# Patient Record
Sex: Female | Born: 1970 | Race: White | Hispanic: No | Marital: Married | Smoking: Former smoker
Health system: Southern US, Community
[De-identification: ages and names within clinical notes are randomized; demographics above are authoritative.]

## PROBLEM LIST (undated history)

## (undated) DIAGNOSIS — K589 Irritable bowel syndrome without diarrhea: Secondary | ICD-10-CM

## (undated) DIAGNOSIS — M069 Rheumatoid arthritis, unspecified: Secondary | ICD-10-CM

## (undated) DIAGNOSIS — I10 Essential (primary) hypertension: Secondary | ICD-10-CM

## (undated) DIAGNOSIS — K5792 Diverticulitis of intestine, part unspecified, without perforation or abscess without bleeding: Secondary | ICD-10-CM

## (undated) DIAGNOSIS — I499 Cardiac arrhythmia, unspecified: Secondary | ICD-10-CM

## (undated) DIAGNOSIS — E669 Obesity, unspecified: Secondary | ICD-10-CM

## (undated) DIAGNOSIS — F17200 Nicotine dependence, unspecified, uncomplicated: Secondary | ICD-10-CM

## (undated) DIAGNOSIS — B009 Herpesviral infection, unspecified: Secondary | ICD-10-CM

## (undated) DIAGNOSIS — F419 Anxiety disorder, unspecified: Secondary | ICD-10-CM

## (undated) HISTORY — DX: Anxiety disorder, unspecified: F41.9

## (undated) HISTORY — DX: Obesity, unspecified: E66.9

## (undated) HISTORY — DX: Herpesviral infection, unspecified: B00.9

## (undated) HISTORY — DX: Nicotine dependence, unspecified, uncomplicated: F17.200

## (undated) HISTORY — DX: Diverticulitis of intestine, part unspecified, without perforation or abscess without bleeding: K57.92

## (undated) HISTORY — PX: ABDOMINAL HYSTERECTOMY: SHX81

## (undated) HISTORY — PX: LAPAROSCOPIC ASSISTED VAGINAL HYSTERECTOMY: SHX5398

## (undated) HISTORY — PX: ABLATION: SHX5711

## (undated) HISTORY — DX: Irritable bowel syndrome, unspecified: K58.9

---

## 2002-07-24 ENCOUNTER — Other Ambulatory Visit: Admission: RE | Admit: 2002-07-24 | Discharge: 2002-07-24 | Payer: Self-pay | Admitting: Obstetrics and Gynecology

## 2004-02-17 ENCOUNTER — Other Ambulatory Visit: Admission: RE | Admit: 2004-02-17 | Discharge: 2004-02-17 | Payer: Self-pay | Admitting: Obstetrics and Gynecology

## 2004-10-14 ENCOUNTER — Ambulatory Visit (HOSPITAL_COMMUNITY): Admission: RE | Admit: 2004-10-14 | Discharge: 2004-10-14 | Payer: Self-pay | Admitting: Obstetrics and Gynecology

## 2004-10-14 ENCOUNTER — Encounter (INDEPENDENT_AMBULATORY_CARE_PROVIDER_SITE_OTHER): Payer: Self-pay | Admitting: Specialist

## 2004-12-08 ENCOUNTER — Ambulatory Visit: Payer: Self-pay | Admitting: Cardiology

## 2005-02-24 ENCOUNTER — Other Ambulatory Visit: Admission: RE | Admit: 2005-02-24 | Discharge: 2005-02-24 | Payer: Self-pay | Admitting: Obstetrics and Gynecology

## 2007-08-23 ENCOUNTER — Ambulatory Visit: Payer: Self-pay | Admitting: Cardiology

## 2009-01-12 ENCOUNTER — Encounter: Admission: RE | Admit: 2009-01-12 | Discharge: 2009-01-12 | Payer: Self-pay | Admitting: Obstetrics and Gynecology

## 2010-04-15 ENCOUNTER — Encounter: Admission: RE | Admit: 2010-04-15 | Discharge: 2010-04-15 | Payer: Self-pay | Admitting: Obstetrics and Gynecology

## 2011-01-17 ENCOUNTER — Encounter: Payer: Self-pay | Admitting: Obstetrics and Gynecology

## 2011-05-10 NOTE — Assessment & Plan Note (Signed)
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  Patient has been referred for a  pressure-like sensation that she has been experiencing over the last  several weeks; however, over the last several days after she was given  some Xanax from Dr. Sherril Croon, it has much improved.  She denies any  substernal chest pain on exertion.  She states that she has a heaviness  usually in the evening, which is sometimes associated with palpitations.  She denies any orthopnea or PND.  She has no syncope.  Patient reports  that she has gained 50 pounds over the year.  She states that part of it  has been stress-induced, although stress in her life now has somewhat  abated.  She has been seen in the office by Dr. Diona Browner for  palpitations and was placed on Cardizem; however, she ended up with  lower extremity edema and had to discontinue this.  She is now currently  on atenolol.  She reports rare palpitations.  She has had an  echocardiographic study done, which by preliminary report was within  normal limits.   MEDICATIONS:  1. Atenolol 50 mg p.o. daily.  2. Xanax 0.25 mg p.o. b.i.d.   ALLERGIES:  No known drug allergies.  The patient does have a SHELLFISH  allergy.   PAST MEDICAL HISTORY:  1. Status post endometrial ablation back in October, 2004.  2. Status post bilateral tubal ligation in 1996.   SOCIAL HISTORY:  Patient is divorced.  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  She used to smoke a half pack a day.  Denies any  significant alcohol use.   FAMILY HISTORY:  Significant for atrial fibrillation and hypertension.   REVIEW OF SYSTEMS:  As per HPI.  No nausea, vomiting, fever,  chills,  melena, hematochezia, dysuria or frequency.  Otherwise within normal  limits.   PHYSICAL EXAMINATION:  VITAL SIGNS:  Blood pressure 134/85, heart rate  52, weight is 188 pounds.  NECK:  Normal carotid upstrokes.  No carotid bruits.  LUNGS:  Clear breath sounds bilaterally.  HEART:  Regular rate and rhythm.  Normal S1 and S2.  ABDOMEN:  Soft.  EXTREMITIES:  No clubbing, cyanosis or edema.  NEURO:  Patient is alert, oriented, grossly nonfocal.   PROBLEM LIST:  1. Atypical chest pain.  2. History of palpitations.  3. Prior history of tobacco use.   PLAN:  1. The patient's chest pain is very atypical.  I have recommended to      the patient to proceed with stress testing.  2. We will review the echocardiogram from Dr. Sherril Croon.  3. Patient also was given the advice to increase her atenolol to 25 mg      in the evening and continue  an additional 50 mg in the morning.  4. Patient can follow up with [place] after review of stress testing.     Learta Codding, MD,FACC  Electronically Signed    GED/MedQ  DD: 08/24/2007  DT: 08/25/2007  Job #: [id number]   cc:   Doreen Beam

## 2011-05-13 NOTE — Op Note (Signed)
NAME:  Kayla Olson, Kayla Olson               ACCOUNT NO.:  [id number]   MEDICAL RECORD NO.:  [id number]          PATIENT TYPE:  AMB   LOCATION:  [facility]                           FACILITY:  [facility]   PHYSICIAN:  Guy Sandifer. Tomblin II, M.D.DATE OF BIRTH:  1971/01/29   DATE OF PROCEDURE:  10/14/2004  DATE OF DISCHARGE:                                 OPERATIVE REPORT   PREOPERATIVE DIAGNOSIS:  Menometrorrhagia.   POSTOPERATIVE DIAGNOSIS:  Menometrorrhagia.   PROCEDURE:  Hysteroscopy, dilation and curettage, Novasure endometrial  ablation, and 1% Xylocaine paracervical block.   SURGEON:  Guy Sandifer. Henderson Cloud, M.D.   ANESTHESIA:  MAC.   ESTIMATED BLOOD LOSS:  Less than 50 mL.   INPUTS AND OUTPUTS:  Distending media, 20 mL deficit.   SPECIMENS:  Endometrial curettings.   INDICATIONS AND CONSENT:  The patient is a 40 year old white female, G2, P2,  status post tubal ligation with heavy irregular menses.  The details are  dictated in the history and physical.  After a discussion of the options,  she is being admitted for hysteroscopy, D&C, and Novasure endometrial  ablation.  The potential risks and complications were discussed with the  patient preoperatively including but not limited to infection, uterine  perforation, bowel, bladder or ureteral damage, bleeding requiring  transfusion of blood products with possible transfusion reaction, HIV and  hepatitis acquisition, DVT, PE, pneumonia, laparotomy, recurrent  menorrhagia, postoperative pain, and tubal syndrome.  All questions have  been answered and consent is signed on the chart.   FINDINGS:  The endometrial canal was normal.   PROCEDURE:  The patient was taken to the operating room where she was  identified, placed in the dorsal supine position, and intravenous anesthetic  is administered.  She was then placed in the dorsal lithotomy position where  she was prepped abdominal and vaginally with Hibiclens, straight  catheterized, and draped in a  sterile fashion.  A bivalve speculum was  placed in the vagina and the anterior cervical lip is injected with 1%  Xylocaine plain.  It was then grasped with a single tooth tenaculum.  A  paracervical block was placed at the 2, 4, 5, 7, 8, and 10 o'clock positions  with approximately 20 mL total of 1% Xylocaine plain.  The uterus was then  sounded to 8 cm and the endocervical canal sounds to 4 cm with a Hegar  dilator.  The cervix was dilated to 27 Pratt dilator.  The diagnostic  hysteroscope was placed in the endocervical canal and advanced under direct  visualization using distending media.  The above findings were noted.  The  hysteroscope was withdrawn and sharp curettage was carried out.  The  Novasure device was placed in the intrauterine cavity according to the  manufacturers directions.  The intrauterine cavity width is 2.8 cm.  After a  good cavity test, ablation is carried out for 1 minute 50 seconds.  The  Novasure  device is removed intact.  Reinspection with the hysteroscope reveals good  ablation and no evidence of perforation.  All instruments are removed and  good hemostasis is noted.  All counts were correct.  The patient was  awakened and taken to the recovery room in stable condition.      JET/MEDQ  D:  10/14/2004  T:  10/14/2004  Job:  [id number]

## 2011-05-13 NOTE — H&P (Signed)
NAME:  Kayla Olson, Kayla Olson               ACCOUNT NO.:  [id number]   MEDICAL RECORD NO.:  [id number]          PATIENT TYPE:  AMB   LOCATION:  [facility]                           FACILITY:  [facility]   PHYSICIAN:  Guy Sandifer. Tomblin II, M.D.DATE OF BIRTH:  [birth year]   DATE OF ADMISSION:  10/14/2004  DATE OF DISCHARGE:                                HISTORY & PHYSICAL   CHIEF COMPLAINT:  Heavy irregular menses.   HISTORY OF PRESENT ILLNESS:  This patient is a 40 year old divorced white  female, G2, P2, status post tubal ligation who has persistent irregular and  heavy menses.  She is intolerant of the birth control pill.  Ultrasound in  September of 2003 reveals a normal size uterus.  After a careful discussion  of the options, she is being admitted for hysteroscopy, D&C and Novasure  endometrial ablation.   PAST MEDICAL HISTORY:  1.  Hypertension.  2.  Irregular heart rate.  3.  Abnormal Pap smear.   PAST SURGICAL HISTORY:  Tubal ligation in 1996.   MEDICATIONS:  1.  Cardizem 120 mg daily.  2.  Xanax 0.25 mg daily p.r.n.  3.  Avelox daily that the patient will finish on October 13, 2004 or October 14, 2004.   FAMILY HISTORY:  Diabetes in grandmother, high blood pressure in father,  unknown type of cancer.   OBSTETRIC HISTORY:  Vaginal delivery x2.   SOCIAL HISTORY:  The patient denies tobacco, alcohol or drug abuse.   REVIEW OF SYSTEMS:  NEUROLOGICAL:  The patient has menstrual migraines.  CARDIAC:  Denies chest pain.  PULMONARY:  Recent upper respiratory  infection, now feeling better on antibiotics.  GASTROINTESTINAL:  Denies  recent changes in bowel habits.   PHYSICAL EXAMINATION:  VITAL SIGNS:  Height 5 feet 3 inches, weight 130  pounds.  Blood pressure 120/78.  HEENT:  Without thyromegaly.  LUNGS:  Clear to auscultation.  HEART:  Regular rate and rhythm.  BACK:  Without CVA tenderness.  BREASTS:  Without masses or active discharge.  ABDOMEN:  Soft and nontender without  masses.  PELVIC:  Vulva, vagina and cervix without lesions.  Uterus is retroverted,  normal size, mobile and nontender.  Adnexa nontender without masses.  EXTREMITIES/NEUROLOGIC:  Exams grossly within normal limits.   ASSESSMENT:  Menometrorrhagia.   PLAN:  Hysteroscopy, dilatation and curettage and Novasure endometrial  ablation.      JET/MEDQ  D:  10/12/2004  T:  10/12/2004  Job:  [id number]

## 2012-06-12 ENCOUNTER — Other Ambulatory Visit: Payer: Self-pay | Admitting: Internal Medicine

## 2012-06-12 DIAGNOSIS — N644 Mastodynia: Secondary | ICD-10-CM

## 2012-06-12 DIAGNOSIS — N63 Unspecified lump in unspecified breast: Secondary | ICD-10-CM

## 2012-06-19 ENCOUNTER — Ambulatory Visit
Admission: RE | Admit: 2012-06-19 | Discharge: 2012-06-19 | Disposition: A | Discharge status: Home / Self Care | Payer: BC Managed Care – PPO | Source: Ambulatory Visit | Attending: Internal Medicine | Admitting: Internal Medicine

## 2012-06-19 ENCOUNTER — Other Ambulatory Visit: Payer: Self-pay | Admitting: Internal Medicine

## 2012-06-19 DIAGNOSIS — N63 Unspecified lump in unspecified breast: Secondary | ICD-10-CM

## 2012-06-19 DIAGNOSIS — N644 Mastodynia: Secondary | ICD-10-CM

## 2014-04-13 ENCOUNTER — Emergency Department (HOSPITAL_COMMUNITY)
Admission: EM | Admit: 2014-04-13 | Discharge: 2014-04-13 | Disposition: A | Discharge status: Home / Self Care | Payer: BC Managed Care – PPO | Attending: Emergency Medicine | Admitting: Emergency Medicine

## 2014-04-13 ENCOUNTER — Encounter (HOSPITAL_COMMUNITY): Payer: Self-pay | Admitting: Emergency Medicine

## 2014-04-13 ENCOUNTER — Emergency Department (HOSPITAL_COMMUNITY): Payer: BC Managed Care – PPO

## 2014-04-13 DIAGNOSIS — R52 Pain, unspecified: Secondary | ICD-10-CM | POA: Insufficient documentation

## 2014-04-13 DIAGNOSIS — I10 Essential (primary) hypertension: Secondary | ICD-10-CM | POA: Insufficient documentation

## 2014-04-13 DIAGNOSIS — F17200 Nicotine dependence, unspecified, uncomplicated: Secondary | ICD-10-CM | POA: Insufficient documentation

## 2014-04-13 DIAGNOSIS — R110 Nausea: Secondary | ICD-10-CM | POA: Insufficient documentation

## 2014-04-13 DIAGNOSIS — R079 Chest pain, unspecified: Secondary | ICD-10-CM | POA: Insufficient documentation

## 2014-04-13 DIAGNOSIS — R0602 Shortness of breath: Secondary | ICD-10-CM | POA: Insufficient documentation

## 2014-04-13 HISTORY — DX: Cardiac arrhythmia, unspecified: I49.9

## 2014-04-13 HISTORY — DX: Essential (primary) hypertension: I10

## 2014-04-13 LAB — COMPREHENSIVE METABOLIC PANEL
ALK PHOS: 60 U/L (ref 39–117)
ALT: 31 U/L (ref 0–35)
AST: 24 U/L (ref 0–37)
Albumin: 3.4 g/dL — ABNORMAL LOW (ref 3.5–5.2)
BUN: 7 mg/dL (ref 6–23)
CALCIUM: 9.4 mg/dL (ref 8.4–10.5)
CO2: 28 meq/L (ref 19–32)
Chloride: 97 mEq/L (ref 96–112)
Creatinine, Ser: 0.65 mg/dL (ref 0.50–1.10)
GFR calc Af Amer: >90 mL/min (ref >90)
Glucose, Bld: 100 mg/dL — ABNORMAL HIGH (ref 70–99)
Potassium: 3.6 mEq/L — ABNORMAL LOW (ref 3.7–5.3)
Sodium: 136 mEq/L — ABNORMAL LOW (ref 137–147)
Total Bilirubin: 0.4 mg/dL (ref 0.3–1.2)
Total Protein: 6.8 g/dL (ref 6.0–8.3)

## 2014-04-13 LAB — CBC WITH DIFFERENTIAL/PLATELET
BASOS ABS: 0 10*3/uL (ref 0.0–0.1)
BASOS PCT: 0 % (ref 0–1)
EOS PCT: 0 % (ref 0–5)
Eosinophils Absolute: 0 10*3/uL (ref 0.0–0.7)
HCT: 42 % (ref 36.0–46.0)
HEMOGLOBIN: 14.3 g/dL (ref 12.0–15.0)
LYMPHS PCT: 12 % (ref 12–46)
Lymphs Abs: 1.5 10*3/uL (ref 0.7–4.0)
MCH: 32 pg (ref 26.0–34.0)
MCHC: 34 g/dL (ref 30.0–36.0)
MCV: 94 fL (ref 78.0–100.0)
MONO ABS: 1.3 10*3/uL — AB (ref 0.1–1.0)
Monocytes Relative: 10 % (ref 3–12)
NEUTROS PCT: 78 % — AB (ref 43–77)
Neutro Abs: 9.9 10*3/uL — ABNORMAL HIGH (ref 1.7–7.7)
PLATELETS: 230 10*3/uL (ref 150–400)
RBC: 4.47 MIL/uL (ref 3.87–5.11)
RDW: 13 % (ref 11.5–15.5)
WBC: 12.7 10*3/uL — ABNORMAL HIGH (ref 4.0–10.5)

## 2014-04-13 LAB — PRO B NATRIURETIC PEPTIDE: PRO B NATRI PEPTIDE: 29.6 pg/mL (ref 0–125)

## 2014-04-13 LAB — TROPONIN I: Troponin I: <0.3 ng/mL (ref <0.30)

## 2014-04-13 LAB — D-DIMER, QUANTITATIVE (NOT AT ARMC)

## 2014-04-13 MED ORDER — ASPIRIN 81 MG PO CHEW
324.0000 mg | CHEWABLE_TABLET | Freq: Once | ORAL | Status: AC
  Administered 2014-04-13: 324 mg via ORAL
  Filled 2014-04-13: qty 4

## 2014-04-13 MED ORDER — NITROGLYCERIN 0.4 MG SL SUBL
0.4000 mg | SUBLINGUAL_TABLET | SUBLINGUAL | Status: DC | PRN
  Filled 2014-04-13: qty 1

## 2014-04-13 NOTE — ED Provider Notes (Signed)
CSN: [id number]     Arrival date & time 04/13/14  1710 History  This chart was scribed for Kayla Creasehristopher J. Alecsander Hattabaugh, * by Danella Maiersaroline Early, ED Scribe. This patient was seen in room APA12/APA12 and the patient's care was started at 5:19 PM.    Chief Complaint  Patient presents with  . Chest Pain   The history is provided by the patient. No language interpreter was used.   HPI Comments: Irving CopasJonsie Drolet is a 43 y.o. female with a h/o HTN who presents to the Emergency Department complaining of non-radiating center CP described as tightness and pressure onset when she woke up this morning. She reports associated SOB that has gradually worsened throughout the day. She reports intermittent nausea today. She denies cough, abdominal pain. She reports h/o PVCs. She has had an ECHO which showed heart wall thinkening from HTN since early 20s but otherwise normal. Never had a stress test. No family h/o early cardiac death. She denies acid reflux.    Past Medical History  Diagnosis Date  . Hypertension   . Arrhythmia    Past Surgical History  Procedure Laterality Date  . Ablation     Family History  Problem Relation Age of Onset  . Cancer Mother   . Atrial fibrillation Mother    History  Substance Use Topics  . Smoking status: Current Every Day Smoker -- 1.00 packs/day for 22 years    Types: Cigarettes  . Smokeless tobacco: Never Used  . Alcohol Use: Yes     Comment: occasionally   OB History   Grav Para Term Preterm Abortions TAB SAB Ect Mult Living   2 2 2       2      Review of Systems  Respiratory: Positive for shortness of breath. Negative for cough.   Cardiovascular: Positive for chest pain.  Gastrointestinal: Positive for nausea. Negative for abdominal pain.  All other systems reviewed and are negative.     Allergies  Codeine  Home Medications   Prior to Admission medications   Not on File   BP 140/84  Pulse 98  Temp(Src) 98.6 F (37 C) (Oral)  Resp 20  Ht 5\' 3"  (1.6 m)   Wt 170 lb (77.111 kg)  BMI 30.12 kg/m2  SpO2 100% Physical Exam  Constitutional: She is oriented to person, place, and time. She appears well-developed and well-nourished. No distress.  HENT:  Head: Normocephalic and atraumatic.  Right Ear: Hearing normal.  Left Ear: Hearing normal.  Nose: Nose normal.  Mouth/Throat: Oropharynx is clear and moist and mucous membranes are normal.  Eyes: Conjunctivae and EOM are normal. Pupils are equal, round, and reactive to light.  Neck: Normal range of motion. Neck supple.  Cardiovascular: Regular rhythm, S1 normal and S2 normal.  Exam reveals no gallop and no friction rub.   No murmur heard. Pulmonary/Chest: Effort normal and breath sounds normal. No respiratory distress. She exhibits no tenderness.  Abdominal: Soft. Normal appearance and bowel sounds are normal. There is no hepatosplenomegaly. There is no tenderness. There is no rebound, no guarding, no tenderness at McBurney's point and negative Murphy's sign. No hernia.  Musculoskeletal: Normal range of motion.  Neurological: She is alert and oriented to person, place, and time. She has normal strength. No cranial nerve deficit or sensory deficit. Coordination normal. GCS eye subscore is 4. GCS verbal subscore is 5. GCS motor subscore is 6.  Skin: Skin is warm, dry and intact. No rash noted. No cyanosis.  Psychiatric: She has  a normal mood and affect. Her speech is normal and behavior is normal. Thought content normal.    ED Course  Procedures (including critical care time) Medications  nitroGLYCERIN (NITROSTAT) SL tablet 0.4 mg (0.4 mg Sublingual Not Given 04/13/14 1815)  aspirin chewable tablet 324 mg (324 mg Oral Given 04/13/14 1815)    DIAGNOSTIC STUDIES: Oxygen Saturation is 100% on RA, normal by my interpretation.    COORDINATION OF CARE: 5:24 PM- Discussed treatment plan with pt which includes CXR, blood work, troponin. Pt agrees to plan.    Labs Review Labs Reviewed  CBC WITH  DIFFERENTIAL - Abnormal; Notable for the following:    WBC 12.7 (*)    Neutrophils Relative % 78 (*)    Neutro Abs 9.9 (*)    Monocytes Absolute 1.3 (*)    All other components within normal limits  COMPREHENSIVE METABOLIC PANEL - Abnormal; Notable for the following:    Sodium 136 (*)    Potassium 3.6 (*)    Glucose, Bld 100 (*)    Albumin 3.4 (*)    All other components within normal limits  PRO B NATRIURETIC PEPTIDE  TROPONIN I  D-DIMER, QUANTITATIVE  TROPONIN I    Imaging Review Dg Chest Port 1 View  04/13/2014   CLINICAL DATA:  Mid left-sided chest pain.  EXAM: PORTABLE CHEST - 1 VIEW  COMPARISON:  08/11/2011.  FINDINGS: Lung volumes are low. No consolidative airspace disease. No pleural effusions. No pneumothorax. No pulmonary nodule or mass noted. Pulmonary vasculature and the cardiomediastinal silhouette are within normal limits.  IMPRESSION: Low lung volumes without radiographic evidence of acute cardiopulmonary disease.   Electronically Signed   By: Trudie Reedaniel  Entrikin M.D.   On: 04/13/2014 17:55     EKG Interpretation None      Date: 04/13/2014  Rate: 94  Rhythm: normal sinus rhythm  QRS Axis: normal  Intervals: normal  ST/T Wave abnormalities: nonspecific T wave changes and Ant-septal leads  Conduction Disutrbances:none  Narrative Interpretation:   Old EKG Reviewed: none available   MDM   Final diagnoses:  Acute chest pain   Patient presents to the ER for evaluation of chest pain. She has risk factors that include hypertension and smoking history. There is no family history of heart disease, she is not a diabetic. Symptoms came on at rest. There was no radiation of the pain, it was central and described as a pressure and heaviness. He was present for more than an hour and then spontaneously resolved. She declined a nitroglycerin. The pain went away without any interventions other than aspirin.  Patient's initial EKG did show T-wave inversions in the anterior and  septal leads. There are no previous EKGs to compare. There were no ST segment deviations. This is nonspecific, but can be considered possible sign of ischemia. This was shared with the patient. Her troponin was negative. I discussed her relatively low cardiac risk category, however with the abnormal EKG, recommended hospitalization. The patient was very hesitant. She reports that she works for her primary care physician, Doctor Kirstie PeriAshish Shah, and would prefer to followup with him. I did explain to her the idea of serial cardiac enzymes and the possibility of missing an MI without continued observation. She understands this but wishes to be discharged and see her doctor in the morning. She was therefore held for a repeat troponin. This was negative as well patient continues to be symptom-free.  I personally performed the services described in this documentation, which was scribed in  my presence. The recorded information has been reviewed and is accurate.    Kayla Crease, MD 04/13/14 2113

## 2014-04-13 NOTE — Discharge Instructions (Signed)
Chest Pain (Nonspecific) °It is often hard to give a specific diagnosis for the cause of chest pain. There is always a chance that your pain could be related to something serious, such as a heart attack or a blood clot in the lungs. You need to follow up with your caregiver for further evaluation. °CAUSES  °· Heartburn. °· Pneumonia or bronchitis. °· Anxiety or stress. °· Inflammation around your heart (pericarditis) or lung (pleuritis or pleurisy). °· A blood clot in the lung. °· A collapsed lung (pneumothorax). It can develop suddenly on its own (spontaneous pneumothorax) or from injury (trauma) to the chest. °· Shingles infection (herpes zoster virus). °The chest wall is composed of bones, muscles, and cartilage. Any of these can be the source of the pain. °· The bones can be bruised by injury. °· The muscles or cartilage can be strained by coughing or overwork. °· The cartilage can be affected by inflammation and become sore (costochondritis). °DIAGNOSIS  °Lab tests or other studies, such as X-rays, electrocardiography, stress testing, or cardiac imaging, may be needed to find the cause of your pain.  °TREATMENT  °· Treatment depends on what may be causing your chest pain. Treatment may include: °· Acid blockers for heartburn. °· Anti-inflammatory medicine. °· Pain medicine for inflammatory conditions. °· Antibiotics if an infection is present. °· You may be advised to change lifestyle habits. This includes stopping smoking and avoiding alcohol, caffeine, and chocolate. °· You may be advised to keep your head raised (elevated) when sleeping. This reduces the chance of acid going backward from your stomach into your esophagus. °· Most of the time, nonspecific chest pain will improve within 2 to 3 days with rest and mild pain medicine. °HOME CARE INSTRUCTIONS  °· If antibiotics were prescribed, take your antibiotics as directed. Finish them even if you start to feel better. °· For the next few days, avoid physical  activities that bring on chest pain. Continue physical activities as directed. °· Do not smoke. °· Avoid drinking alcohol. °· Only take over-the-counter or prescription medicine for pain, discomfort, or fever as directed by your caregiver. °· Follow your caregiver's suggestions for further testing if your chest pain does not go away. °· Keep any follow-up appointments you made. If you do not go to an appointment, you could develop lasting (chronic) problems with pain. If there is any problem keeping an appointment, you must call to reschedule. °SEEK MEDICAL CARE IF:  °· You think you are having problems from the medicine you are taking. Read your medicine instructions carefully. °· Your chest pain does not go away, even after treatment. °· You develop a rash with blisters on your chest. °SEEK IMMEDIATE MEDICAL CARE IF:  °· You have increased chest pain or pain that spreads to your arm, neck, jaw, back, or abdomen. °· You develop shortness of breath, an increasing cough, or you are coughing up blood. °· You have severe back or abdominal pain, feel nauseous, or vomit. °· You develop severe weakness, fainting, or chills. °· You have a fever. °THIS IS AN EMERGENCY. Do not wait to see if the pain will go away. Get medical help at once. Call your local emergency services (911 in [place] Do not drive yourself to the hospital. °MAKE SURE YOU:  °· Understand these instructions. °· Will watch your condition. °· Will get help right away if you are not doing well or get worse. °Document Released: 09/21/2005 Document Revised: 03/05/2012 Document Reviewed: 07/17/2008 °[employer]® Patient Information ©2014 [employer]   [employer]. ° °

## 2014-04-13 NOTE — ED Notes (Signed)
Patient requesting to hold off on taking nitro SL due to pain being better. States some pressure, but no pain.

## 2014-04-13 NOTE — ED Notes (Signed)
Patient c/o mid-sternal chest pain that radiates to left side. Per patient pain started this morning. Reports shortness of breath, weakness, nausea, and headache,. Patient reports hx of arrhythmia.

## 2014-10-27 ENCOUNTER — Encounter (HOSPITAL_COMMUNITY): Payer: Self-pay | Admitting: Emergency Medicine

## 2016-01-08 ENCOUNTER — Encounter: Payer: Self-pay | Admitting: Rheumatology

## 2016-07-01 ENCOUNTER — Encounter: Payer: Self-pay | Admitting: Rheumatology

## 2016-07-01 LAB — BASIC METABOLIC PANEL
BUN: 12 mg/dL (ref 4–21)
Creatinine: 0.7 mg/dL (ref 0.5–1.1)
Glucose: 87 mg/dL
Potassium: 4.3 mmol/L (ref 3.4–5.3)
SODIUM: 141 mmol/L (ref 137–147)

## 2016-07-01 LAB — CBC AND DIFFERENTIAL
HEMATOCRIT: 42 % (ref 36–46)
Hemoglobin: 13.9 g/dL (ref 12.0–16.0)
Platelets: 229 10*3/uL (ref 150–399)
WBC: 6.3 10^3/mL

## 2016-07-01 LAB — HEPATIC FUNCTION PANEL
ALK PHOS: 52 U/L (ref 25–125)
ALT: 26 U/L (ref 7–35)
AST: 16 U/L (ref 13–35)
Bilirubin, Total: 0.2 mg/dL

## 2016-09-23 ENCOUNTER — Encounter (HOSPITAL_COMMUNITY): Payer: Self-pay | Admitting: Emergency Medicine

## 2016-09-23 ENCOUNTER — Emergency Department (HOSPITAL_COMMUNITY)
Admission: EM | Admit: 2016-09-23 | Discharge: 2016-09-23 | Disposition: A | Discharge status: Home / Self Care | Payer: BLUE CROSS/BLUE SHIELD | Attending: Emergency Medicine | Admitting: Emergency Medicine

## 2016-09-23 DIAGNOSIS — F17210 Nicotine dependence, cigarettes, uncomplicated: Secondary | ICD-10-CM | POA: Diagnosis not present

## 2016-09-23 DIAGNOSIS — R1032 Left lower quadrant pain: Secondary | ICD-10-CM | POA: Diagnosis present

## 2016-09-23 DIAGNOSIS — Z79899 Other long term (current) drug therapy: Secondary | ICD-10-CM | POA: Insufficient documentation

## 2016-09-23 DIAGNOSIS — N390 Urinary tract infection, site not specified: Secondary | ICD-10-CM | POA: Diagnosis not present

## 2016-09-23 DIAGNOSIS — Z791 Long term (current) use of non-steroidal anti-inflammatories (NSAID): Secondary | ICD-10-CM | POA: Insufficient documentation

## 2016-09-23 DIAGNOSIS — I10 Essential (primary) hypertension: Secondary | ICD-10-CM | POA: Diagnosis not present

## 2016-09-23 DIAGNOSIS — R109 Unspecified abdominal pain: Secondary | ICD-10-CM

## 2016-09-23 HISTORY — DX: Rheumatoid arthritis, unspecified: M06.9

## 2016-09-23 LAB — URINALYSIS, ROUTINE W REFLEX MICROSCOPIC
Bilirubin Urine: NEGATIVE
Glucose, UA: NEGATIVE mg/dL
Ketones, ur: NEGATIVE mg/dL
Leukocytes, UA: NEGATIVE
NITRITE: NEGATIVE
PH: 6 (ref 5.0–8.0)
Protein, ur: 30 mg/dL — AB

## 2016-09-23 LAB — URINE MICROSCOPIC-ADD ON

## 2016-09-23 MED ORDER — TRAMADOL HCL 50 MG PO TABS: 50.0000 mg | ORAL_TABLET | Freq: Four times a day (QID) | ORAL | 0 refills | Status: DC | PRN

## 2016-09-23 MED ORDER — NITROFURANTOIN MONOHYD MACRO 100 MG PO CAPS: 100.0000 mg | ORAL_CAPSULE | Freq: Two times a day (BID) | ORAL | 0 refills | Status: DC

## 2016-09-23 MED ORDER — HYDROMORPHONE HCL 1 MG/ML IJ SOLN
0.5000 mg | Freq: Once | INTRAMUSCULAR | Status: AC
  Administered 2016-09-23: 0.5 mg via INTRAMUSCULAR

## 2016-09-23 MED ORDER — HYDROMORPHONE HCL 1 MG/ML IJ SOLN
1.0000 mg | Freq: Once | INTRAMUSCULAR | Status: DC
  Filled 2016-09-23: qty 1

## 2016-09-23 MED ORDER — ONDANSETRON 8 MG PO TBDP
8.0000 mg | ORAL_TABLET | Freq: Once | ORAL | Status: AC
  Administered 2016-09-23: 8 mg via ORAL
  Filled 2016-09-23: qty 1

## 2016-09-23 NOTE — ED Provider Notes (Addendum)
[facility]Y DEPT Provider Note   CSN: [id number] Arrival date & time: 09/23/16  0358     History   Chief Complaint Chief Complaint  Patient presents with  . Flank Pain    HPI Kayla Olson is a 45 y.o. female.  Patient presents to the emergency part with complaints of low back pain and left lower abdomen and pelvis pain. Symptoms have been present tonight. Patient reports constant and severe pain. No associated fever, nausea, vomiting, diarrhea. She has not noticed any urinary symptoms. Patient does have a history of similar pain. Patient reports that she has been getting it monthly and has been evaluated by her OB/GYN, Dr. Mora ApplMcLeod. She has had ultrasound and blood work. 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      Past Medical History:  Diagnosis Date  . Arrhythmia   . Hypertension   . Rheumatoid arthritis (HCC)     There are no active problems to display for this patient.   Past Surgical History:  Procedure Laterality Date  . ABLATION      OB History    Gravida Para Term Preterm AB Living   2 2 2     2    SAB TAB Ectopic Multiple Live Births                   Home Medications    Prior to Admission medications   Medication Sig Start Date End Date Taking? Authorizing Provider  hydroxychloroquine (PLAQUENIL) 200 MG tablet Take by mouth 2 (two) times daily.   Yes Historical Provider, MD  ibuprofen (ADVIL,MOTRIN) 200 MG tablet Take 200 mg by mouth every 6 (six) hours as needed.   Yes Historical Provider, MD  nebivolol (BYSTOLIC) 5 MG tablet Take 2.5 mg by mouth daily.    Yes Historical Provider, MD  tranexamic acid (LYSTEDA) 650 MG TABS tablet Take 1,300 mg by mouth 3 (three) times daily.   Yes Historical Provider, MD  valsartan-hydrochlorothiazide (DIOVAN-HCT) 160-12.5 MG per tablet Take 0.5 tablets by mouth daily.  04/09/14  Yes Historical Provider, MD  valACYclovir (VALTREX) 1000 MG tablet Take 0.5 tablets by mouth at bedtime.  04/09/14   Historical  Provider, MD    Family History Family History  Problem Relation Age of Onset  . Cancer Mother   . Atrial fibrillation Mother     Social History Social History  Substance Use Topics  . Smoking status: Current Every Day Smoker    Packs/day: 1.00    Years: 22.00    Types: Cigarettes  . Smokeless tobacco: Never Used  . Alcohol use Yes     Comment: occasionally     Allergies   Codeine   Review of Systems Review of Systems  Genitourinary: Positive for pelvic pain.  Musculoskeletal: Positive for back pain.  All other systems reviewed and are negative.    Physical Exam Updated Vital Signs BP 133/67 (BP Location: Left Arm)   Pulse 70   Temp 98.1 F (36.7 C) (Oral)   Resp 22   Ht 5\' 4"  (1.626 m)   Wt 190 lb (86.2 kg)   SpO2 98%   BMI 32.61 kg/m   Physical Exam  Constitutional: She is oriented to person, place, and time. She appears well-developed and well-nourished. No distress.  HENT:  Head: Normocephalic and atraumatic.  Right Ear: Hearing normal.  Left Ear: Hearing normal.  Nose: Nose normal.  Mouth/Throat: Oropharynx is clear and moist and mucous membranes are normal.  Eyes: Conjunctivae and EOM  are normal. Pupils are equal, round, and reactive to light.  Neck: Normal range of motion. Neck supple.  Cardiovascular: Regular rhythm, S1 normal and S2 normal.  Exam reveals no gallop and no friction rub.   No murmur heard. Pulmonary/Chest: Effort normal and breath sounds normal. No respiratory distress. She exhibits no tenderness.  Abdominal: Soft. Normal appearance and bowel sounds are normal. There is no hepatosplenomegaly. There is no tenderness. There is no rebound, no guarding, no tenderness at McBurney's point and negative Murphy's sign. No hernia.  Musculoskeletal: Normal range of motion.  Neurological: She is alert and oriented to person, place, and time. She has normal strength. No cranial nerve deficit or sensory deficit. Coordination normal. GCS eye  subscore is 4. GCS verbal subscore is 5. GCS motor subscore is 6.  Skin: Skin is warm, dry and intact. No rash noted. No cyanosis.  Psychiatric: She has a normal mood and affect. Her speech is normal and behavior is normal. Thought content normal.  Nursing note and vitals reviewed.    ED Treatments / Results  Labs (all labs ordered are listed, but only abnormal results are displayed) Labs Reviewed  URINALYSIS, ROUTINE W REFLEX MICROSCOPIC (NOT AT [facility])    EKG  EKG Interpretation None       Radiology No results found.  Procedures Procedures (including critical care time)  Medications Ordered in ED Medications  HYDROmorphone (DILAUDID) injection 1 mg (not administered)  ondansetron (ZOFRAN-ODT) disintegrating tablet 8 mg (not administered)     Initial Impression / Assessment and Plan / ED Course  I have reviewed the triage vital signs and the nursing notes.  Pertinent labs & imaging results that were available during my care of the patient were reviewed by me and considered in my medical decision making (see chart for details).  Clinical Course   Patient presents with pain in the low back and left lower pelvic region. This has been a recurrent pain for her for some time. He has been associated with her menstrual cycle and she is followed by Dr. Mora Appl, OB/GYN in [place]. Patient does not wish to have any workup at this time, reports that she has had ultrasounds and blood work and they have not shown anything. She wishes to have "the edge knocked off" of her pain. Her examination is reassuring. She does not have any tenderness on examination, certainly no signs of acute surgical process.  Urinalysis does have many squamous cells, but there are also is evidence of possible infection. Will treat with 3 day course of Macrobid.  Final Clinical Impressions(s) / ED Diagnoses   Final diagnoses:  None  Pelvic and flank pain, recurrent  New Prescriptions New Prescriptions   No  medications on file     Gilda Crease, MD 09/23/16 0422    Gilda Crease, MD 09/23/16 [phone]

## 2016-09-23 NOTE — ED Triage Notes (Signed)
Pt states she started having pain to her left flank and lower back x 1 hour, states she has had this pain before and was told it was "post-ablation pain"  Pt states she had an ablation in 2006.

## 2016-09-24 ENCOUNTER — Encounter (HOSPITAL_COMMUNITY): Payer: Self-pay | Admitting: Emergency Medicine

## 2016-09-24 ENCOUNTER — Emergency Department (HOSPITAL_COMMUNITY)
Admission: EM | Admit: 2016-09-24 | Discharge: 2016-09-24 | Disposition: A | Discharge status: Home / Self Care | Payer: BLUE CROSS/BLUE SHIELD | Attending: Emergency Medicine | Admitting: Emergency Medicine

## 2016-09-24 ENCOUNTER — Emergency Department (HOSPITAL_COMMUNITY): Payer: BLUE CROSS/BLUE SHIELD

## 2016-09-24 DIAGNOSIS — Z791 Long term (current) use of non-steroidal anti-inflammatories (NSAID): Secondary | ICD-10-CM | POA: Diagnosis not present

## 2016-09-24 DIAGNOSIS — F17210 Nicotine dependence, cigarettes, uncomplicated: Secondary | ICD-10-CM | POA: Diagnosis not present

## 2016-09-24 DIAGNOSIS — R112 Nausea with vomiting, unspecified: Secondary | ICD-10-CM | POA: Diagnosis not present

## 2016-09-24 DIAGNOSIS — I10 Essential (primary) hypertension: Secondary | ICD-10-CM | POA: Insufficient documentation

## 2016-09-24 DIAGNOSIS — Z79899 Other long term (current) drug therapy: Secondary | ICD-10-CM | POA: Diagnosis not present

## 2016-09-24 DIAGNOSIS — R1032 Left lower quadrant pain: Secondary | ICD-10-CM | POA: Diagnosis present

## 2016-09-24 LAB — URINE MICROSCOPIC-ADD ON

## 2016-09-24 LAB — COMPREHENSIVE METABOLIC PANEL
ALBUMIN: 4 g/dL (ref 3.5–5.0)
ALK PHOS: 52 U/L (ref 38–126)
ALT: 26 U/L (ref 14–54)
ANION GAP: 7 (ref 5–15)
AST: 21 U/L (ref 15–41)
BUN: 12 mg/dL (ref 6–20)
CALCIUM: 9.1 mg/dL (ref 8.9–10.3)
CO2: 27 mmol/L (ref 22–32)
Chloride: 105 mmol/L (ref 101–111)
Creatinine, Ser: 0.95 mg/dL (ref 0.44–1.00)
GFR calc Af Amer: >60 mL/min (ref >60)
GFR calc non Af Amer: >60 mL/min (ref >60)
GLUCOSE: 100 mg/dL — AB (ref 65–99)
Potassium: 3.7 mmol/L (ref 3.5–5.1)
SODIUM: 139 mmol/L (ref 135–145)
Total Bilirubin: 0.3 mg/dL (ref 0.3–1.2)
Total Protein: 7.2 g/dL (ref 6.5–8.1)

## 2016-09-24 LAB — CBC WITH DIFFERENTIAL/PLATELET
Basophils Absolute: 0.1 10*3/uL (ref 0.0–0.1)
Basophils Relative: 1 %
EOS ABS: 0 10*3/uL (ref 0.0–0.7)
Eosinophils Relative: 1 %
HCT: 43 % (ref 36.0–46.0)
HEMOGLOBIN: 14.8 g/dL (ref 12.0–15.0)
Lymphocytes Relative: 28 %
Lymphs Abs: 2.2 10*3/uL (ref 0.7–4.0)
MCH: 32.5 pg (ref 26.0–34.0)
MCHC: 34.4 g/dL (ref 30.0–36.0)
MCV: 94.5 fL (ref 78.0–100.0)
MONOS PCT: 9 %
Monocytes Absolute: 0.7 10*3/uL (ref 0.1–1.0)
NEUTROS ABS: 4.9 10*3/uL (ref 1.7–7.7)
Neutrophils Relative %: 61 %
Platelets: 251 10*3/uL (ref 150–400)
RBC: 4.55 MIL/uL (ref 3.87–5.11)
RDW: 12.9 % (ref 11.5–15.5)
WBC: 7.9 10*3/uL (ref 4.0–10.5)

## 2016-09-24 LAB — URINALYSIS, ROUTINE W REFLEX MICROSCOPIC
Bilirubin Urine: NEGATIVE
Glucose, UA: NEGATIVE mg/dL
Ketones, ur: NEGATIVE mg/dL
Leukocytes, UA: NEGATIVE
Nitrite: NEGATIVE
PH: 6 (ref 5.0–8.0)
Protein, ur: NEGATIVE mg/dL

## 2016-09-24 LAB — LIPASE, BLOOD: Lipase: 22 U/L (ref 11–51)

## 2016-09-24 MED ORDER — IOPAMIDOL (ISOVUE-300) INJECTION 61%
100.0000 mL | Freq: Once | INTRAVENOUS | Status: AC | PRN
  Administered 2016-09-24: 100 mL via INTRAVENOUS

## 2016-09-24 MED ORDER — ONDANSETRON HCL 4 MG/2ML IJ SOLN
4.0000 mg | Freq: Once | INTRAMUSCULAR | Status: AC
  Administered 2016-09-24: 4 mg via INTRAVENOUS
  Filled 2016-09-24: qty 2

## 2016-09-24 MED ORDER — HYDROMORPHONE HCL 1 MG/ML IJ SOLN
1.0000 mg | INTRAMUSCULAR | Status: DC | PRN
  Administered 2016-09-24: 1 mg via INTRAVENOUS
  Filled 2016-09-24: qty 1

## 2016-09-24 MED ORDER — IOPAMIDOL (ISOVUE-300) INJECTION 61%
INTRAVENOUS | Status: AC
  Filled 2016-09-24: qty 30

## 2016-09-24 MED ORDER — SODIUM CHLORIDE 0.9 % IV BOLUS (SEPSIS)
500.0000 mL | Freq: Once | INTRAVENOUS | Status: AC
  Administered 2016-09-24: 500 mL via INTRAVENOUS

## 2016-09-24 MED ORDER — ONDANSETRON HCL 4 MG PO TABS: 4.0000 mg | ORAL_TABLET | Freq: Four times a day (QID) | ORAL | 0 refills | Status: DC

## 2016-09-24 MED ORDER — HYDROCODONE-ACETAMINOPHEN 5-325 MG PO TABS
1.0000 | ORAL_TABLET | ORAL | 0 refills | Status: DC | PRN
Start: 2016-09-24 — End: 2021-12-22

## 2016-09-24 MED ORDER — MORPHINE SULFATE (PF) 4 MG/ML IV SOLN
4.0000 mg | Freq: Once | INTRAVENOUS | Status: AC
  Administered 2016-09-24: 4 mg via INTRAVENOUS
  Filled 2016-09-24: qty 1

## 2016-09-24 MED ORDER — HYDROCODONE-ACETAMINOPHEN 5-325 MG PO TABS
1.0000 | ORAL_TABLET | Freq: Once | ORAL | Status: AC
  Administered 2016-09-24: 1 via ORAL
  Filled 2016-09-24: qty 1

## 2016-09-24 NOTE — ED Triage Notes (Signed)
Patient c/o abd pain with nausea and dry heaving. Per patient this particular pain started an hour ago but she was seen Thursday night in ER. Patient given tramadol with no relief. Per patient pain is due to post surgical complication from ablation in 2006 where blood is trapped in uterus but she is unable to pass it. Patient being seen by Dr Mora ApplMcLeod for abd pain and was given Tranexamic and was told if medication didn't relieved pain she would need a hysterectomy.

## 2016-09-24 NOTE — Discharge Instructions (Signed)

## 2016-09-24 NOTE — ED Provider Notes (Signed)
Emergency Department Provider Note   I have reviewed the triage vital signs and the nursing notes.   HISTORY  Chief Complaint Abdominal Pain   HPI Kayla Olson is a 45 y.o. female with PMH of RA and HTN presents to the emergency department for evaluation of left lower quadrant abdominal pain and pelvic discomfort. [name] had similar discomfort in the past withconsultation from a 2006 ablation. She is followed by Dr. Mora Appl in [facility]. She reports pain at the start of her menstrual cycle which began one week prior. She has been taking trans-exam a gas it as instructed with no improvement in pain. She notes that there is a plan to perform hysterectomy at this medication fails to improve symptoms. The patient denies any upper abdominal discomfort. No chest pain or difficulty breathing. No fevers or chills. Patient states that after discharge from the emergency department she felt well the next day and then pain began abruptly early this afternoon.   Past Medical History:  Diagnosis Date  . Arrhythmia   . Hypertension   . Rheumatoid arthritis (HCC)     There are no active problems to display for this patient.   Past Surgical History:  Procedure Laterality Date  . ABLATION      Current Outpatient Rx  . Order #: [id number] Class: Historical Med  . Order #: [id number] Class: Historical Med  . Order #: [id number] Class: Historical Med  . Order #: [id number] Class: Historical Med  . Order #: [id number] Class: Print  . Order #: [id number] Class: Historical Med  . Order #: [id number] Class: Historical Med  . Order #: [id number] Class: Historical Med  . Order #: [id number] Class: Print    Allergies Codeine  Family History  Problem Relation Age of Onset  . Cancer Mother   . Atrial fibrillation Mother     Social History Social History  Substance Use Topics  . Smoking status: Current Every Day Smoker    Packs/day: 1.00    Years: 22.00    Types: Cigarettes  . Smokeless tobacco:  Never Used  . Alcohol use Yes     Comment: occasionally    Review of Systems  Constitutional: No fever/chills Eyes: No visual changes. ENT: No sore throat. Cardiovascular: Denies chest pain. Respiratory: Denies shortness of breath. Gastrointestinal: LLQ abdominal pain. Positive nausea and vomiting.  No diarrhea.  No constipation. Genitourinary: Negative for dysuria. Musculoskeletal: Negative for back pain. Skin: Negative for rash. Neurological: Negative for headaches, focal weakness or numbness.  10-point ROS otherwise negative.  ____________________________________________   PHYSICAL EXAM:  VITAL SIGNS: ED Triage Vitals  Enc Vitals Group     BP 09/24/16 1547 (!) 174/110     Pulse Rate 09/24/16 1547 72     Resp 09/24/16 1547 22     SpO2 09/24/16 1547 98 %     Pain Score 09/24/16 1552 9   Constitutional: Alert and oriented. Appears uncomfortable with crying at times and active vomiting.  Eyes: Conjunctivae are normal.  Head: Atraumatic. Nose: No congestion/rhinnorhea. Mouth/Throat: Mucous membranes are moist.  Oropharynx non-erythematous. Neck: No stridor Cardiovascular: Normal rate, regular rhythm. Good peripheral circulation. Grossly normal heart sounds.   Respiratory: Normal respiratory effort.  No retractions. Lungs CTAB. Gastrointestinal: Soft with moderate LLQ tenderness. No distention.  Musculoskeletal: No lower extremity tenderness nor edema. No gross deformities of extremities. Neurologic:  Normal speech and language. No gross focal neurologic deficits are appreciated.  Skin:  Skin is warm, dry and intact. No rash noted. Psychiatric: Mood and affect are normal. Speech  and behavior are normal.  ____________________________________________   LABS (all labs ordered are listed, but only abnormal results are displayed)  Labs Reviewed  COMPREHENSIVE METABOLIC PANEL - Abnormal; Notable for the following:       Result Value   Glucose, Bld 100 (*)    All other  components within normal limits  URINALYSIS, ROUTINE W REFLEX MICROSCOPIC (NOT AT [facility]) - Abnormal; Notable for the following:    Specific Gravity, Urine >1.030 (*)    Hgb urine dipstick LARGE (*)    All other components within normal limits  URINE MICROSCOPIC-ADD ON - Abnormal; Notable for the following:    Squamous Epithelial / LPF 6-30 (*)    Bacteria, UA FEW (*)    All other components within normal limits  LIPASE, BLOOD  CBC WITH DIFFERENTIAL/PLATELET   ____________________________________________  RADIOLOGY  US Transvaginal Non-ob  Result Date: 09/24/2016 CLINICAL DATA:  Left lower quadrant pain since endometrial ablation in 2006 EXAM: TRANSABDOMINAL AND TRANSVAGINAL ULTRASOUND OF PELVIS DOPPLER ULTRASOUND OF OVARIES TECHNIQUE: Both transabdominal and transvaginal ultrasound examinations of the pelvis were performed. Transabdominal technique was performed for global imaging of the pelvis including uterus, ovaries, adnexal regions, and pelvic cul-de-sac. It was necessary to proceed with endovaginal exam following the transabdominal exam to visualize the endometrium and ovaries. Color and duplex Doppler ultrasound was utilized to evaluate blood flow to the ovaries. COMPARISON:  None. FINDINGS: Uterus Measurements: 8.7 x 4.5 x 6.6 cm. No fibroids or other mass visualized. Endometrium Thickness: 3.6 mm.  No focal abnormality visualized. Right ovary Measurements: 3 x 2.8 x 2.9 cm. Normal appearance/no adnexal mass. Left ovary Measurements: 2.5 x 1.4 x 1.5 cm. Normal appearance/no adnexal mass. Pulsed Doppler evaluation of both ovaries demonstrates normal low-resistance arterial and venous waveforms. Other findings No abnormal free fluid. IMPRESSION: 1. No ovarian torsion. 2. No acute sonographic pelvic abnormality. Electronically Signed   By: Elige Ko   On: 09/24/2016 17:40   US Pelvis Complete  Result Date: 09/24/2016 CLINICAL DATA:  Left lower quadrant pain since endometrial ablation in  2006 EXAM: TRANSABDOMINAL AND TRANSVAGINAL ULTRASOUND OF PELVIS DOPPLER ULTRASOUND OF OVARIES TECHNIQUE: Both transabdominal and transvaginal ultrasound examinations of the pelvis were performed. Transabdominal technique was performed for global imaging of the pelvis including uterus, ovaries, adnexal regions, and pelvic cul-de-sac. It was necessary to proceed with endovaginal exam following the transabdominal exam to visualize the endometrium and ovaries. Color and duplex Doppler ultrasound was utilized to evaluate blood flow to the ovaries. COMPARISON:  None. FINDINGS: Uterus Measurements: 8.7 x 4.5 x 6.6 cm. No fibroids or other mass visualized. Endometrium Thickness: 3.6 mm.  No focal abnormality visualized. Right ovary Measurements: 3 x 2.8 x 2.9 cm. Normal appearance/no adnexal mass. Left ovary Measurements: 2.5 x 1.4 x 1.5 cm. Normal appearance/no adnexal mass. Pulsed Doppler evaluation of both ovaries demonstrates normal low-resistance arterial and venous waveforms. Other findings No abnormal free fluid. IMPRESSION: 1. No ovarian torsion. 2. No acute sonographic pelvic abnormality. Electronically Signed   By: Elige Ko   On: 09/24/2016 17:40   Ct Abdomen Pelvis W Contrast  Result Date: 09/24/2016 CLINICAL DATA:  Acute onset of generalized abdominal pain, nausea and dry heaves. Initial encounter. EXAM: CT ABDOMEN AND PELVIS WITH CONTRAST TECHNIQUE: Multidetector CT imaging of the abdomen and pelvis was performed using the standard protocol following bolus administration of intravenous contrast. CONTRAST:  ISOVUE-300 IOPAMIDOL (ISOVUE-300) INJECTION 61% COMPARISON:  Pelvic ultrasound performed earlier today at 5:06 p.m. FINDINGS: Lower chest: Minimal  bibasilar atelectasis is noted. The lungs are otherwise clear. Hepatobiliary: The liver is unremarkable in appearance. The gallbladder is unremarkable in appearance. The common bile duct remains normal in caliber. Pancreas: The pancreas is within  normal limits. Spleen: The spleen is unremarkable in appearance. Adrenals/Urinary Tract: The adrenal glands are unremarkable in appearance. The kidneys are within normal limits. There is no evidence of hydronephrosis. No renal or ureteral stones are identified. No perinephric stranding is seen. Stomach/Bowel: The stomach is unremarkable in appearance. The small bowel is within normal limits. The appendix is normal in caliber, without evidence of appendicitis. The colon is unremarkable in appearance. Vascular/Lymphatic: The abdominal aorta is unremarkable in appearance. The inferior vena cava is grossly unremarkable. No retroperitoneal lymphadenopathy is seen. No pelvic sidewall lymphadenopathy is identified. A circumaortic left renal vein is noted. Reproductive: The bladder is mildly distended and grossly unremarkable. There is vague heterogeneity with regard to the uterus, thought reflect the phase of contrast enhancement given recent negative pelvic ultrasound. The ovaries are grossly symmetric and unremarkable in appearance. No suspicious adnexal masses are seen. Trace free fluid within the pelvis is likely physiologic in nature. Other: No additional soft tissue abnormalities are seen. Musculoskeletal: No acute osseous abnormalities are identified. The visualized musculature is unremarkable in appearance. IMPRESSION: No acute abnormality seen within the abdomen and pelvis. Electronically Signed   By: Roanna RaiderJeffery  Chang M.D.   On: 09/24/2016 20:26   [place] Art/ven Flow Abd Pelv Doppler  Result Date: 09/24/2016 CLINICAL DATA:  Left lower quadrant pain since endometrial ablation in 2006 EXAM: TRANSABDOMINAL AND TRANSVAGINAL ULTRASOUND OF PELVIS DOPPLER ULTRASOUND OF OVARIES TECHNIQUE: Both transabdominal and transvaginal ultrasound examinations of the pelvis were performed. Transabdominal technique was performed for global imaging of the pelvis including uterus, ovaries, adnexal regions, and pelvic cul-de-sac. It was  necessary to proceed with endovaginal exam following the transabdominal exam to visualize the endometrium and ovaries. Color and duplex Doppler ultrasound was utilized to evaluate blood flow to the ovaries. COMPARISON:  None. FINDINGS: Uterus Measurements: 8.7 x 4.5 x 6.6 cm. No fibroids or other mass visualized. Endometrium Thickness: 3.6 mm.  No focal abnormality visualized. Right ovary Measurements: 3 x 2.8 x 2.9 cm. Normal appearance/no adnexal mass. Left ovary Measurements: 2.5 x 1.4 x 1.5 cm. Normal appearance/no adnexal mass. Pulsed Doppler evaluation of both ovaries demonstrates normal low-resistance arterial and venous waveforms. Other findings No abnormal free fluid. IMPRESSION: 1. No ovarian torsion. 2. No acute sonographic pelvic abnormality. Electronically Signed   By: Elige KoHetal  Patel   On: 09/24/2016 17:40    ____________________________________________   PROCEDURES  Procedure(s) performed:   Procedures  None ____________________________________________   INITIAL IMPRESSION / ASSESSMENT AND PLAN / ED COURSE  Pertinent labs & imaging results that were available during my care of the patient were reviewed by me and considered in my medical decision making (see chart for details).  Patient resents to the emergency department for evaluation of lower abdominal and pelvic pain. [name] had similar pain in the past and is thought to be due to complication from uterine ablation. Do not feel I can adequately rule out ovarian torsion in this situation. She hasn't appears significantly uncomfortable. Consider gastrointestinal etiology. Very low suspicion for vascular etiology of pain. Plan for baseline labs, symptom management, ultrasound of the pelvis to assess ovarian blood flow.   06:39 PM Patient with continued severe pain. Pelvic ultrasound with no evidence of torsion. No other abnormality found. Plan for CT scan of the abdomen and pelvis.  08:41 PM Patient with much improved abdominal  discomfort. CT scan is unremarkable. Patient will follow with her OB/GYN on Monday. Discharge home with Vicodin and Zofran for symptom management at home.   At this time, I do not feel there is any life-threatening condition present. I have reviewed and discussed all results (EKG, imaging, lab, urine as appropriate), exam findings with patient. I have reviewed nursing notes and appropriate previous records.  I feel the patient is safe to be discharged home without further emergent workup. Discussed usual and customary return precautions. Patient and family (if present) verbalize understanding and are comfortable with this plan.  Patient will follow-up with their primary care provider. If they do not have a primary care provider, information for follow-up has been provided to them. All questions have been answered.  ____________________________________________  FINAL CLINICAL IMPRESSION(S) / ED DIAGNOSES  Final diagnoses:  Left lower quadrant pain     MEDICATIONS GIVEN DURING THIS VISIT:  Medications  HYDROmorphone (DILAUDID) injection 1 mg (1 mg Intravenous Given 09/24/16 1841)  iopamidol (ISOVUE-300) 61 % injection (not administered)  HYDROcodone-acetaminophen (NORCO/VICODIN) 5-325 MG per tablet 1 tablet (not administered)  sodium chloride 0.9 % bolus 500 mL (0 mLs Intravenous Stopped 09/24/16 1738)  morphine 4 MG/ML injection 4 mg (4 mg Intravenous Given 09/24/16 1622)  ondansetron (ZOFRAN) injection 4 mg (4 mg Intravenous Given 09/24/16 1622)  morphine 4 MG/ML injection 4 mg (4 mg Intravenous Given 09/24/16 1735)  morphine 4 MG/ML injection 4 mg (4 mg Intravenous Given 09/24/16 1822)  iopamidol (ISOVUE-300) 61 % injection 100 mL (100 mLs Intravenous Contrast Given 09/24/16 1938)  ondansetron (ZOFRAN) injection 4 mg (4 mg Intravenous Given 09/24/16 1934)     NEW OUTPATIENT MEDICATIONS STARTED DURING THIS VISIT:  None    Note:  This document was prepared using Dragon voice recognition  software and may include unintentional dictation errors.  Alona Bene, MD Emergency Medicine   Maia Plan, MD 09/25/16 [phone]

## 2016-09-24 NOTE — ED Notes (Signed)
Pt c/o nausea after drinking contrast- EDP made aware.

## 2016-09-24 NOTE — ED Notes (Signed)
Pt reports slight relief of "sharp" pain in abdomen after Morphine doses for short amount of time, approximately 5-10 minutes and then pt increases back up to 8/9. Pt is rocking back and forth in bed, appears very uncomfortable. MD notified.

## 2016-11-07 DIAGNOSIS — M0579 Rheumatoid arthritis with rheumatoid factor of multiple sites without organ or systems involvement: Secondary | ICD-10-CM | POA: Insufficient documentation

## 2016-11-07 DIAGNOSIS — H04123 Dry eye syndrome of bilateral lacrimal glands: Secondary | ICD-10-CM | POA: Insufficient documentation

## 2016-11-07 DIAGNOSIS — Z79899 Other long term (current) drug therapy: Secondary | ICD-10-CM | POA: Insufficient documentation

## 2016-11-07 DIAGNOSIS — B009 Herpesviral infection, unspecified: Secondary | ICD-10-CM | POA: Insufficient documentation

## 2016-11-07 DIAGNOSIS — I10 Essential (primary) hypertension: Secondary | ICD-10-CM | POA: Insufficient documentation

## 2016-11-07 DIAGNOSIS — F17200 Nicotine dependence, unspecified, uncomplicated: Secondary | ICD-10-CM | POA: Insufficient documentation

## 2016-11-07 DIAGNOSIS — F419 Anxiety disorder, unspecified: Secondary | ICD-10-CM | POA: Insufficient documentation

## 2016-11-07 DIAGNOSIS — M542 Cervicalgia: Secondary | ICD-10-CM | POA: Insufficient documentation

## 2016-11-07 DIAGNOSIS — K589 Irritable bowel syndrome without diarrhea: Secondary | ICD-10-CM | POA: Insufficient documentation

## 2016-11-07 DIAGNOSIS — K5792 Diverticulitis of intestine, part unspecified, without perforation or abscess without bleeding: Secondary | ICD-10-CM | POA: Insufficient documentation

## 2016-11-07 NOTE — Progress Notes (Deleted)
   Office Visit Note  Patient: Kayla Olson             Date of Birth: 06/16/1971           MRN: [id number]             PCP: Kirstie PeriSHAH,ASHISH, MD Referring: Kirstie PeriShah, Ashish, MD Visit Date: 11/10/2016 Occupation: CMA    Subjective:  No chief complaint on file.   History of Present Illness: Kayla CopasJonsie Koegel is a 45 y.o. female ***   Activities of Daily Living:  Patient reports morning stiffness for *** {minute/hour:19697}.   Patient {ACTIONS;DENIES/REPORTS:21021675::"Denies"} nocturnal pain.  Difficulty dressing/grooming: {ACTIONS;DENIES/REPORTS:21021675::"Denies"} Difficulty climbing stairs: {ACTIONS;DENIES/REPORTS:21021675::"Denies"} Difficulty getting out of chair: {ACTIONS;DENIES/REPORTS:21021675::"Denies"} Difficulty using hands for taps, buttons, cutlery, and/or writing: {ACTIONS;DENIES/REPORTS:21021675::"Denies"}   No Rheumatology ROS completed.   PMFS History:  Patient Active Problem List   Diagnosis Date Noted  . Rheumatoid arthritis of multiple sites with negative rheumatoid factor (HCC) 11/07/2016  . High risk medication use 11/07/2016  . Dry eyes 11/07/2016  . Neck pain, musculoskeletal 11/07/2016  . Essential hypertension 11/07/2016  . Smoker 11/07/2016  . Anxiety 11/07/2016  . Irritable bowel syndrome 11/07/2016  . Diverticulitis of intestine without perforation or abscess 11/07/2016  . Herpes 11/07/2016    Past Medical History:  Diagnosis Date  . Arrhythmia   . Hypertension   . Rheumatoid arthritis (HCC)     Family History  Problem Relation Age of Onset  . Cancer Mother   . Atrial fibrillation Mother    Past Surgical History:  Procedure Laterality Date  . ABLATION     Social History   Social History Narrative  . No narrative on file     Objective: Vital Signs: There were no vitals taken for this visit.   Physical Exam   Musculoskeletal Exam: ***  CDAI Exam: No CDAI exam completed.    Investigation: Findings:  09/17/15: CCP 5, RF 106.8, ANA  negative 10/22/15: TB Negative, hepatitis panel negative, G6PD negative, SPEP normal, immunoglobulins normal, CK normal 03/29/16: 14-3-3 eta negative 07/01/2016 CMP normal, CBC normal, vitamin D 47.8    Imaging: No results found.  Speciality Comments: No specialty comments available.    Procedures:  No procedures performed Allergies: Codeine   Assessment / Plan: Visit Diagnoses: Rheumatoid arthritis of multiple sites with negative rheumatoid factor (HCC) - 14-3-3n negative, positive mild synovitis on ultrasound  High risk medication use - Plaquenil 200 mg twice a day Monday to Friday, eye exam December 2016  Dry eyes  Neck pain, musculoskeletal  Essential hypertension  Smoker  Anxiety  Irritable bowel syndrome  Diverticulitis   Herpes - History of chronic herpes    Orders: No orders of the defined types were placed in this encounter.  No orders of the defined types were placed in this encounter.   Face-to-face time spent with patient was *** minutes. 50% of time was spent in counseling and coordination of care.  Follow-Up Instructions: No Follow-up on file.   Pollyann SavoyShaili Maddelynn Moosman, MD

## 2016-11-09 ENCOUNTER — Encounter: Payer: Self-pay | Admitting: *Deleted

## 2016-11-10 ENCOUNTER — Ambulatory Visit: Payer: Self-pay | Admitting: Rheumatology

## 2016-12-22 DIAGNOSIS — Z8719 Personal history of other diseases of the digestive system: Secondary | ICD-10-CM | POA: Insufficient documentation

## 2016-12-22 DIAGNOSIS — Z8679 Personal history of other diseases of the circulatory system: Secondary | ICD-10-CM | POA: Insufficient documentation

## 2016-12-22 NOTE — Progress Notes (Signed)
Office Visit Note  Patient: Kayla Olson             Date of Birth: 08/11/1971           MRN: [id number]             PCP: Kirstie PeriSHAH,ASHISH, MD Referring: Kirstie PeriShah, Ashish, MD Visit Date: 12/27/2016 Occupation: @GUAROCC @    Subjective:  No chief complaint on file. Follow-up on rheumatoid arthritis and high risk prescription  History of Present Illness: Kayla CopasJonsie Abdella is a 45 y.o. female  Last seen 06/14/2016 Patient is doing really well with her rheumatoid arthritis. All of her joint pain swelling and stiffness has resolved long time ago after being on Plaquenil. Currently on 200 mg twice a day Monday through Friday and getting adequate response. Her last Plaquenil eye exam was December 2016 and was normal. She was planning on going December 2017 for Plaquenil eye exam but she had to have a hysterectomy done at that same time the eye exam was scheduled. As a result she will go sometime in the next week or 2 to get the eye exam done. Note: The Plaquenil eye exam cannot be done at her doctor's office and eaten and she was referred to the same doctor who practices also out of [place]. Kayla Olson to drive to [place] due to work schedule and therefore it's making it more of a challenge to get the Plaquenil eye exam done.  Patient has a significant amount of stress in her life lately with her family. As a result she had quit smoking for more than 3 weeks and she restarted. Unfortunately she is smoking almost twice as much as that time when she had quit . Namely: She was smoking half pack per day and now has gone to one pack per day.  Her last labs were done November 2017 which included CBC with differential and CMP with GFR. According to the patient, they were normal. Her next labs will be due in about 5 months from the November labs.  She is complaining of mild occasional discomfort to the right wrist joint at times. I am unsure whether this discomfort is related to rheumatoid arthritis being  poorly controlled versus osteoarthritis versus just change of weather and exacerbating old damaged joint of wrist.  Activities of Daily Living:  Patient reports morning stiffness for 5 minutes.   Patient Denies nocturnal pain.  Difficulty dressing/grooming: Denies Difficulty climbing stairs: Denies Difficulty getting out of chair: Denies Difficulty using hands for taps, buttons, cutlery, and/or writing: Denies   No Rheumatology ROS completed.   PMFS History:  Patient Active Problem List   Diagnosis Date Noted  . History of hypertension 12/22/2016  . History of diverticulitis 12/22/2016  . Rheumatoid arthritis with rheumatoid factor of multiple sites without organ or systems involvement (HCC) 11/07/2016  . High risk medication use 11/07/2016  . Dry eyes 11/07/2016  . Neck pain, musculoskeletal 11/07/2016  . Essential hypertension 11/07/2016  . Smoker 11/07/2016  . Anxiety 11/07/2016  . Irritable bowel syndrome 11/07/2016  . Diverticulitis of intestine without perforation or abscess 11/07/2016  . Herpes 11/07/2016    Past Medical History:  Diagnosis Date  . Anxiety   . Arrhythmia   . Diverticulitis   . Herpes    chronic  . Hypertension   . IBS (irritable bowel syndrome)   . Obesity   . Rheumatoid arthritis (HCC)   . Smoker    Chronic    Family History  Problem Relation Age  of Onset  . Cancer Mother   . Atrial fibrillation Mother    Past Surgical History:  Procedure Laterality Date  . ABLATION     Social History   Social History Narrative  . No narrative on file     Objective: Vital Signs: There were no vitals taken for this visit.   Physical Exam   Musculoskeletal Exam:  Full range of motion of all joints Grip strength is equal and strong bilaterally Fibromyalgia tender points are all absent  CDAI Exam: No CDAI exam completed.    Investigation: Findings:   September 2016, labs done at Dr. Margaretmary EddyShah's office show sed rate was 2.  ANA was negative.   TSH was normal.  CBC was normal.  Comprehensive metabolic panel shows an ALT of 34.  Uric acid was 6.4.  TCP was 5.  Rheumatoid factor was 106.8.  Labs from 10/22/2015 show CBC with diff is normal.  CMP with GFR normal except for ALT slightly elevated at 49.  Note that it is going to stay elevated because she is on Valtrex, and she has to take Valtrex on a regular basis.  SPEP M-spike is negative.  Hep panel negative.  IgG, IgA, and IgM are normal.  G6PD is negative.  TB Gold is negative.  CK is negative.   09/23/2015 Ultrasound of the bilateral hands today.  Per EULAR recommendations, an ultrasound examination of the bilateral hands was performed using a 12 MHz transducer, gray scale, and power Doppler of the bilateral second, third, and fifth MCP joints and bilateral wrist joints in which both dorsal and volar aspects were evaluated.  The findings were that she had positive synovitis in the second, third, and fifth MCP joints bilaterally with some synovial thickening and also had synovitis in the bilateral wrist joints.  The right median nerve was biped, which was 0.10 cm2, and the left was 0.08 cm2 ,which were within normal limits.  These findings were consistent with inflammatory arthritis, most likely rheumatoid arthritis.   I also obtained an ultrasound of her left elbow as she was complaining of left elbow joint pain.  There was no significant joint space narrowing noted.  The humeral ulnar joint showed synovitis on both the dorsal and volar aspects consistent with inflammatory arthritis.  11/10/2015  X-rays of bilateral feet are normal.   X-rays of bilateral elbow joints are normal.   X-rays of bilateral hands normal except for mild juxtaarticular osteopenia.    Imaging: No results found.  Speciality Comments: No specialty comments available.    Procedures:  No procedures performed Allergies: Codeine and Shellfish allergy   Assessment / Plan:     Visit Diagnoses: Rheumatoid  arthritis with rheumatoid factor of multiple sites without organ or systems involvement (HCC)  High risk medication use  Smoker  History of hypertension  History of diverticulitis   [place] #1: Rheumatoid arthritis is relatively well controlled except I'm concerned about the right wrist joint. Patient has slight amount of pain to the right wrist joint on palpation. I question whether she is having mild amount of poorly controlled RA of the right wrist only versus OA versus exacerbation of old damage to the right wrist with the change of weather.(Recent temperatures have been down in the 20s).  #2: Patient is using Plaquenil 200 mg twice a day Monday through Friday as prescribed.  #3: Plaquenil eye exam was normal December 2016 and is due again in December 2017. She attempted to go to her eye appointment but  the Plaquenil toxicity machine was not functioning and patient was referred to the [place] office to get the exam done. At the time of that appointment patient actually had to undergo hysterectomy and was unable to go to the Plaquenil eye exam visit. She has verbally promised me that she will go in the next few weeks to have this done. Patient understands the importance of getting this done urgently.  #4: History of dry eyes. Using GenTeal gel with good relief. She used to have significant problems with her eyes in the past and now it's very tolerable.  #6: Patient had stopped smoking for a good 3 weeks. Due to family stresses she restarted smoking and instead of going back to half pack per day, she is doing one pack per day. I've encouraged the patient to make a better effort to stop smoking and 18 for next office visit in June 2018 to accomplish that. I've encouraged her to at least 2 half pack per day by that time if she cannot stop completely. Patient understands and is agreeable  #7: Patient had CBC with differential CMP with GFR done November 2017 at the time of her hysterectomy. She  will get those labs faxed to our office.  #8: Refill Plaquenil 200 mg Monday through Friday dispense 135 pills with 1 refill.(Although the patient's Plaquenil eye exam is not done, she promises to have it done on an urgent basis in the next few weeks. I've advised the patient to stop Plaquenil if she does not have the eye exam done by February/March 2018).  #9: Return to clinic in 5 months  Orders: No orders of the defined types were placed in this encounter.  No orders of the defined types were placed in this encounter.   Face-to-face time spent with patient was 30 minutes. 50% of time was spent in counseling and coordination of care.  Follow-Up Instructions: No Follow-up on file.   Amy Littrell, RT

## 2016-12-27 ENCOUNTER — Ambulatory Visit (INDEPENDENT_AMBULATORY_CARE_PROVIDER_SITE_OTHER): Payer: BLUE CROSS/BLUE SHIELD | Admitting: Rheumatology

## 2016-12-27 ENCOUNTER — Encounter: Payer: Self-pay | Admitting: Rheumatology

## 2016-12-27 VITALS — BP 142/76 | HR 70 | Ht 63.0 in | Wt 178.0 lb

## 2016-12-27 DIAGNOSIS — Z79899 Other long term (current) drug therapy: Secondary | ICD-10-CM | POA: Diagnosis not present

## 2016-12-27 DIAGNOSIS — Z8719 Personal history of other diseases of the digestive system: Secondary | ICD-10-CM | POA: Diagnosis not present

## 2016-12-27 DIAGNOSIS — M0579 Rheumatoid arthritis with rheumatoid factor of multiple sites without organ or systems involvement: Secondary | ICD-10-CM | POA: Diagnosis not present

## 2016-12-27 DIAGNOSIS — Z8679 Personal history of other diseases of the circulatory system: Secondary | ICD-10-CM | POA: Diagnosis not present

## 2016-12-27 DIAGNOSIS — F17200 Nicotine dependence, unspecified, uncomplicated: Secondary | ICD-10-CM

## 2016-12-27 MED ORDER — HYDROXYCHLOROQUINE SULFATE 200 MG PO TABS: ORAL_TABLET | ORAL | 1 refills | Status: DC

## 2016-12-27 NOTE — Patient Instructions (Addendum)
Hand Exercises Introduction Hand exercises can be helpful to almost anyone. These exercises can strengthen the hands, improve flexibility and movement, and increase blood flow to the hands. These results can make work and daily tasks easier. Hand exercises can be especially helpful for people who have joint pain from arthritis or have nerve damage from overuse (carpal tunnel syndrome). These exercises can also help people who have injured a hand. Most of these hand exercises are fairly gentle stretching routines. You can do them often throughout the day. Still, it is a good idea to ask your health care provider which exercises would be best for you. Warming your hands before exercise may help to reduce stiffness. You can do this with gentle massage or by placing your hands in warm water for 15 minutes. Also, make sure you pay attention to your level of hand pain as you begin an exercise routine. Exercises Knuckle Bend  Repeat this exercise 5-10 times with each hand. 1. Stand or sit with your arm, hand, and all five fingers pointed straight up. Make sure your wrist is straight. 2. Gently and slowly bend your fingers down and inward until the tips of your fingers are touching the tops of your palm. 3. Hold this position for a few seconds. 4. Extend your fingers out to their original position, all pointing straight up again. Finger Fan  Repeat this exercise 5-10 times with each hand. 1. Hold your arm and hand out in front of you. Keep your wrist straight. 2. Squeeze your hand into a fist. 3. Hold this position for a few seconds. 4. Fan out, or spread apart, your hand and fingers as much as possible, stretching every joint fully. Tabletop  Repeat this exercise 5-10 times with each hand. 1. Stand or sit with your arm, hand, and all five fingers pointed straight up. Make sure your wrist is straight. 2. Gently and slowly bend your fingers at the knuckles where they meet the hand until your hand is  making an upside-down L shape. Your fingers should form a tabletop. 3. Hold this position for a few seconds. 4. Extend your fingers out to their original position, all pointing straight up again. Making Os  Repeat this exercise 5-10 times with each hand. 1. Stand or sit with your arm, hand, and all five fingers pointed straight up. Make sure your wrist is straight. 2. Make an O shape by touching your pointer finger to your thumb. Hold for a few seconds. Then open your hand wide. 3. Repeat this motion with each finger on your hand. Table Spread  Repeat this exercise 5-10 times with each hand. 1. Place your hand on a table with your palm facing down. Make sure your wrist is straight. 2. Spread your fingers out as much as possible. Hold this position for a few seconds. 3. Slide your fingers back together again. Hold for a few seconds. Ball Grip  Repeat this exercise 10-15 times with each hand. 1. Hold a tennis ball or another soft ball in your hand. 2. While slowly increasing pressure, squeeze the ball as hard as possible. 3. Squeeze as hard as you can for 3-5 seconds. 4. Relax and repeat. Wrist Curls  Repeat this exercise 10-15 times with each hand. 1. Sit in a chair that has armrests. 2. Hold a light weight in your hand, such as a dumbbell that weighs 1-3 pounds (0.5-1.4 kg). Ask your health care provider what weight would be best for you. 3. Rest your hand just   over the end of the chair arm with your palm facing up. 4. Gently pivot your wrist up and down while holding the weight. Do not twist your wrist from side to side. Contact a health care provider if:  Your hand pain or discomfort gets much worse when you do an exercise.  Your hand pain or discomfort does not improve within 2 hours after you exercise. If you have any of these problems, stop doing these exercises right away. Do not do them again unless your health care provider says that you can. Get help right away if:  You  develop sudden, severe hand pain. If this happens, stop doing these exercises right away. Do not do them again unless your health care provider says that you can. This information is not intended to replace advice given to you by your health care provider. Make sure you discuss any questions you have with your health care provider. Document Released: 11/23/2015 Document Revised: 05/19/2016 Document Reviewed: 06/22/2015  2017 Elsevier  

## 2017-03-31 ENCOUNTER — Telehealth: Payer: Self-pay | Admitting: *Deleted

## 2017-03-31 NOTE — Telephone Encounter (Signed)
Received CBC and CMP from PCP which were drawn on 03/23/17. Results are normal.

## 2017-04-27 NOTE — Progress Notes (Signed)
Office Visit Note  Patient: Kayla Olson             Date of Birth: December 20, 1971           MRN: [id number]             PCP: Kirstie Peri, MD Referring: Kirstie Peri, MD Visit Date: 05/02/2017 Occupation: @GUAROCC @    Subjective:   Left third finger pain  History of Present Illness: Kayla Olson is a 46 y.o. female with history of sero positive rheumatoid arthritis. She states she is doing fairly well without any joint swelling. [name] been having some discomfort in her left third finger. The pain has been recurrent and has gotten worse. She is left-handed. [name] been using over-the-counter eyedrops for dry eyes.  Activities of Daily Living:  Patient reports morning stiffness for 0 minutes.   Patient Denies nocturnal pain.  Difficulty dressing/grooming: Denies Difficulty climbing stairs: Denies Difficulty getting out of chair: Denies Difficulty using hands for taps, buttons, cutlery, and/or writing: Reports   Review of Systems  Constitutional: Positive for fatigue. Negative for weight gain, weight loss and weakness.  HENT: Negative for mouth sores, mouth dryness and nose dryness.   Eyes: Positive for dryness. Negative for pain, redness and itching.  Respiratory: Negative for cough, shortness of breath and difficulty breathing.   Cardiovascular: Negative for chest pain, palpitations, hypertension, irregular heartbeat and swelling in legs/feet.  Gastrointestinal: Negative for blood in stool, constipation and diarrhea.  Genitourinary: Negative for painful urination.  Musculoskeletal: Positive for arthralgias and joint pain. Negative for joint swelling, myalgias, morning stiffness and myalgias.  Skin: Negative for color change, rash, nodules/bumps, redness, skin tightness and ulcers.  Allergic/Immunologic: Negative for susceptible to infections.  Neurological: Negative for dizziness and headaches.  Hematological: Negative for swollen glands.  Psychiatric/Behavioral: Negative for  depressed mood and sleep disturbance. The patient is not nervous/anxious.     PMFS History:  Patient Active Problem List   Diagnosis Date Noted  . History of hypertension 12/22/2016  . History of diverticulitis 12/22/2016  . Rheumatoid arthritis with rheumatoid factor of multiple sites without organ or systems involvement (HCC) 11/07/2016  . High risk medication use 11/07/2016  . Dry eyes 11/07/2016  . Neck pain, musculoskeletal 11/07/2016  . Essential hypertension 11/07/2016  . Smoker 11/07/2016  . Anxiety 11/07/2016  . Irritable bowel syndrome 11/07/2016  . Diverticulitis of intestine without perforation or abscess 11/07/2016  . Herpes 11/07/2016    Past Medical History:  Diagnosis Date  . Anxiety   . Arrhythmia   . Diverticulitis   . Herpes    chronic  . Hypertension   . IBS (irritable bowel syndrome)   . Obesity   . Rheumatoid arthritis (HCC)   . Smoker    Chronic    Family History  Problem Relation Age of Onset  . Cancer Mother   . Atrial fibrillation Mother    Past Surgical History:  Procedure Laterality Date  . ABLATION    . LAPAROSCOPIC ASSISTED VAGINAL HYSTERECTOMY     Social History   Social History Narrative  . No narrative on file     Objective: Vital Signs: BP 111/67   Pulse 65   Resp 14   Ht 5\' 4"  (1.626 m)   Wt 158 lb (71.7 kg)   BMI 27.12 kg/m    Physical Exam  Constitutional: She is oriented to person, place, and time. She appears well-developed and well-nourished.  HENT:  Head: Normocephalic and atraumatic.  Eyes: Conjunctivae and  EOM are normal.  Neck: Normal range of motion.  Cardiovascular: Normal rate, regular rhythm, normal heart sounds and intact distal pulses.   Pulmonary/Chest: Effort normal and breath sounds normal.  Abdominal: Soft. Bowel sounds are normal.  Lymphadenopathy:    She has no cervical adenopathy.  Neurological: She is alert and oriented to person, place, and time.  Skin: Skin is warm and dry. Capillary  refill takes less than 2 seconds.  Psychiatric: She has a normal mood and affect. Her behavior is normal.  Nursing note and vitals reviewed.    Musculoskeletal Exam: C-spine and thoracic lumbar spine good range of motion. Shoulder joints elbow joints wrist joint MCPs PIPs DIPs with good range of motion. She has a small cyst him on top of her left third PIP joint. Which is tender. Hip joints knee joints ankles MTPs PIPs DIPs are good range of motion with no synovitis.  CDAI Exam: CDAI Homunculus Exam:   Joint Counts:  CDAI Tender Joint count: 0 CDAI Swollen Joint count: 0  Global Assessments:  Patient Global Assessment: 0 Provider Global Assessment: 0    Investigation: Findings:  CBC and CMP from PCP which were drawn on 03/23/17 were normal   01/14/2017 normal PLQ eye exam      Imaging: No results found.  Speciality Comments: No specialty comments available.    Procedures:  No procedures performed Allergies: Codeine and Shellfish allergy   Assessment / Plan:     Visit Diagnoses: Rheumatoid arthritis with rheumatoid factor of multiple sites without organ or systems involvement (HCC) - +RF,-anti CCP, -ANA. Patient has been doing really well on Plaquenil she had no synovitis on examination. [name] having discomfort in her left third PIP joint which I believe is secondary to the cyst. I did discuss possible aspiration of the mucin cyst at this time she would like to hold off. I've advised her to use Voltaren gel topically. Once she is symptom-free for 2 years I would like to taper her Plaquenil.  Dry eyes: [name] been using over-the-counter medications which is been helpful.  High risk medication use - on Plaquenil 200 mg twice a day Monday to Friday. Her labs and eye exam has been normal. She'll be losing her insurance have advised her to get labs in 6 months now.  History of diverticulitis  History of hypertension  Smoker: Smoking cessation discussed.  History of IBS:  She has some ongoing symptoms.  History of Chronic Herpes     Orders: No orders of the defined types were placed in this encounter.  No orders of the defined types were placed in this encounter.   Face-to-face time spent with patient was 20 minutes. 50% of time was spent in counseling and coordination of care.  Follow-Up Instructions: Return in about 6 months (around 11/02/2017) for Rheumatoid arthritis.   Kayla SavoyShaili Tangia Pinard, MD  Note - This record has been created using Animal nutritionistDragon software.  Chart creation errors have been sought, but may not always  have been located. Such creation errors do not reflect on  the standard of medical care.

## 2017-05-02 ENCOUNTER — Ambulatory Visit: Payer: BLUE CROSS/BLUE SHIELD | Admitting: Rheumatology

## 2017-05-02 ENCOUNTER — Encounter: Payer: Self-pay | Admitting: Rheumatology

## 2017-05-02 ENCOUNTER — Ambulatory Visit (INDEPENDENT_AMBULATORY_CARE_PROVIDER_SITE_OTHER): Payer: BLUE CROSS/BLUE SHIELD | Admitting: Rheumatology

## 2017-05-02 VITALS — BP 111/67 | HR 65 | Resp 14 | Ht 64.0 in | Wt 158.0 lb

## 2017-05-02 DIAGNOSIS — F17200 Nicotine dependence, unspecified, uncomplicated: Secondary | ICD-10-CM

## 2017-05-02 DIAGNOSIS — Z8679 Personal history of other diseases of the circulatory system: Secondary | ICD-10-CM

## 2017-05-02 DIAGNOSIS — Z79899 Other long term (current) drug therapy: Secondary | ICD-10-CM | POA: Diagnosis not present

## 2017-05-02 DIAGNOSIS — Z8719 Personal history of other diseases of the digestive system: Secondary | ICD-10-CM | POA: Diagnosis not present

## 2017-05-02 DIAGNOSIS — M0579 Rheumatoid arthritis with rheumatoid factor of multiple sites without organ or systems involvement: Secondary | ICD-10-CM | POA: Diagnosis not present

## 2017-05-02 DIAGNOSIS — B009 Herpesviral infection, unspecified: Secondary | ICD-10-CM

## 2017-05-02 DIAGNOSIS — H04123 Dry eye syndrome of bilateral lacrimal glands: Secondary | ICD-10-CM

## 2017-05-02 NOTE — Patient Instructions (Signed)
Standing Labs We placed an order today for your standing lab work.    Please come back and get your standing labs in September and every 6 months  We have open lab Monday through Friday from 8:30-11:30 AM and 1:30-4 PM at the office of Dr. Arbutus PedShaili Sriman Tally/Naitik Panwala, PA.   The office is located at [address], [address], [place], [place] [address] No appointment is necessary.   Labs are drawn by [facility].  You may receive a bill from [facility] for your lab work.

## 2017-07-03 ENCOUNTER — Ambulatory Visit: Payer: BLUE CROSS/BLUE SHIELD | Admitting: Rheumatology

## 2017-10-18 NOTE — Progress Notes (Deleted)
Office Visit Note  Patient: Kayla Olson             Date of Birth: 1971/09/04           MRN: [id number]             PCP: Kirstie Peri, MD Referring: Kirstie Peri, MD Visit Date: 10/31/2017 Occupation: @GUAROCC @    Subjective:  No chief complaint on file.   History of Present Illness: Kayla Olson is a 46 y.o. female ***   Activities of Daily Living:  Patient reports morning stiffness for *** {minute/hour:19697}.   Patient {ACTIONS;DENIES/REPORTS:21021675::"Denies"} nocturnal pain.  Difficulty dressing/grooming: {ACTIONS;DENIES/REPORTS:21021675::"Denies"} Difficulty climbing stairs: {ACTIONS;DENIES/REPORTS:21021675::"Denies"} Difficulty getting out of chair: {ACTIONS;DENIES/REPORTS:21021675::"Denies"} Difficulty using hands for taps, buttons, cutlery, and/or writing: {ACTIONS;DENIES/REPORTS:21021675::"Denies"}   No Rheumatology ROS completed.   PMFS History:  Patient Active Problem List   Diagnosis Date Noted  . History of hypertension 12/22/2016  . History of diverticulitis 12/22/2016  . Rheumatoid arthritis with rheumatoid factor of multiple sites without organ or systems involvement (HCC) 11/07/2016  . High risk medication use 11/07/2016  . Dry eyes 11/07/2016  . Neck pain, musculoskeletal 11/07/2016  . Essential hypertension 11/07/2016  . Smoker 11/07/2016  . Anxiety 11/07/2016  . Irritable bowel syndrome 11/07/2016  . Diverticulitis of intestine without perforation or abscess 11/07/2016  . Herpes 11/07/2016    Past Medical History:  Diagnosis Date  . Anxiety   . Arrhythmia   . Diverticulitis   . Herpes    chronic  . Hypertension   . IBS (irritable bowel syndrome)   . Obesity   . Rheumatoid arthritis (HCC)   . Smoker    Chronic    Family History  Problem Relation Age of Onset  . Cancer Mother   . Atrial fibrillation Mother    Past Surgical History:  Procedure Laterality Date  . ABLATION    . LAPAROSCOPIC ASSISTED VAGINAL HYSTERECTOMY      Social History   Social History Narrative  . No narrative on file     Objective: Vital Signs: There were no vitals taken for this visit.   Physical Exam   Musculoskeletal Exam: ***  CDAI Exam: No CDAI exam completed.    Investigation: No additional findings.PLQ eye exam: 12/2016 CBC Latest Ref Rng & Units 09/24/2016 07/01/2016 04/13/2014  WBC 4.0 - 10.5 K/uL 7.9 6.3 12.7(H)  Hemoglobin 12.0 - 15.0 g/dL 40.9 81.1 91.4  Hematocrit 36.0 - 46.0 % 43.0 42 42.0  Platelets 150 - 400 K/uL 251 229 230   CMP Latest Ref Rng & Units 09/24/2016 07/01/2016 04/13/2014  Glucose 65 - 99 mg/dL 782(N) - 562(Z)  BUN 6 - 20 mg/dL 12 12 7   Creatinine 0.44 - 1.00 mg/dL 3.08 0.7 6.57  Sodium 846 - 145 mmol/L 139 141 136(L)  Potassium 3.5 - 5.1 mmol/L 3.7 4.3 3.6(L)  Chloride 101 - 111 mmol/L 105 - 97  CO2 22 - 32 mmol/L 27 - 28  Calcium 8.9 - 10.3 mg/dL 9.1 - 9.4  Total Protein 6.5 - 8.1 g/dL 7.2 - 6.8  Total Bilirubin 0.3 - 1.2 mg/dL 0.3 - 0.4  Alkaline Phos 38 - 126 U/L 52 52 60  AST 15 - 41 U/L 21 16 24   ALT 14 - 54 U/L 26 26 31     Imaging: No results found.  Speciality Comments: No specialty comments available.    Procedures:  No procedures performed Allergies: Codeine and Shellfish allergy   Assessment / Plan:     Visit Diagnoses:  No diagnosis found.    Orders: No orders of the defined types were placed in this encounter.  No orders of the defined types were placed in this encounter.   Face-to-face time spent with patient was *** minutes. 50% of time was spent in counseling and coordination of care.  Follow-Up Instructions: No Follow-up on file.   Ellen HenriMarissa C Tailer Volkert, NT  Note - This record has been created using Animal nutritionistDragon software.  Chart creation errors have been sought, but may not always  have been located. Such creation errors do not reflect on  the standard of medical care.

## 2017-10-31 ENCOUNTER — Ambulatory Visit: Payer: BLUE CROSS/BLUE SHIELD | Admitting: Rheumatology

## 2020-04-02 ENCOUNTER — Other Ambulatory Visit (HOSPITAL_COMMUNITY): Payer: Self-pay | Admitting: Nurse Practitioner

## 2020-04-02 DIAGNOSIS — Z1231 Encounter for screening mammogram for malignant neoplasm of breast: Secondary | ICD-10-CM

## 2020-04-10 ENCOUNTER — Encounter (HOSPITAL_COMMUNITY): Payer: Self-pay

## 2020-04-10 ENCOUNTER — Other Ambulatory Visit: Payer: Self-pay

## 2020-04-10 ENCOUNTER — Ambulatory Visit (HOSPITAL_COMMUNITY)
Admission: RE | Admit: 2020-04-10 | Discharge: 2020-04-10 | Disposition: A | Discharge status: Home / Self Care | Payer: 59 | Source: Ambulatory Visit | Attending: Nurse Practitioner | Admitting: Nurse Practitioner

## 2020-04-10 DIAGNOSIS — Z1231 Encounter for screening mammogram for malignant neoplasm of breast: Secondary | ICD-10-CM | POA: Diagnosis not present

## 2021-01-07 ENCOUNTER — Ambulatory Visit (HOSPITAL_COMMUNITY)
Admission: RE | Admit: 2021-01-07 | Discharge: 2021-01-07 | Disposition: A | Discharge status: Home / Self Care | Payer: 59 | Source: Ambulatory Visit | Attending: Nurse Practitioner | Admitting: Nurse Practitioner

## 2021-01-07 ENCOUNTER — Other Ambulatory Visit (HOSPITAL_COMMUNITY): Payer: Self-pay | Admitting: Nurse Practitioner

## 2021-01-07 ENCOUNTER — Other Ambulatory Visit: Payer: Self-pay

## 2021-01-07 DIAGNOSIS — R059 Cough, unspecified: Secondary | ICD-10-CM | POA: Insufficient documentation

## 2021-01-07 DIAGNOSIS — R0602 Shortness of breath: Secondary | ICD-10-CM

## 2021-01-29 ENCOUNTER — Ambulatory Visit (HOSPITAL_COMMUNITY)
Admission: RE | Admit: 2021-01-29 | Discharge: 2021-01-29 | Disposition: A | Discharge status: Home / Self Care | Payer: 59 | Source: Ambulatory Visit | Attending: Internal Medicine | Admitting: Internal Medicine

## 2021-01-29 ENCOUNTER — Other Ambulatory Visit (HOSPITAL_COMMUNITY): Payer: Self-pay | Admitting: Internal Medicine

## 2021-01-29 ENCOUNTER — Other Ambulatory Visit: Payer: Self-pay

## 2021-01-29 DIAGNOSIS — U071 COVID-19: Secondary | ICD-10-CM | POA: Insufficient documentation

## 2021-01-29 DIAGNOSIS — R059 Cough, unspecified: Secondary | ICD-10-CM | POA: Diagnosis present

## 2021-02-01 NOTE — Progress Notes (Incomplete)
CARDIOLOGY CONSULT NOTE       Patient ID: Kayla Olson MRN: [id number] DOB/AGE: Mar 18, 1971 50 y.o.  Admit date: (Not on file) Referring Physician: Sherryll Burger Primary Physician: Kirstie Peri, MD Primary Cardiologist: New Reason for Consultation: Chest Pain and Palpitations  Active Problems:   * No active hospital problems. *   HPI:  50 y.o. referred by Dr Sherryll Burger for palpitations and chest pain History of anxiety, HTN, IBS, smiking and RA. Seen in primary office 01/07/21 after rough night. Felt sudden onset of chest pain. Associated with dizziness, nausea, palpitations and vomiting She has had previous holter monitors 06/04/2003 with PAC;s only ECG in office showed NSR rate 73 with nonspecific ST changes She is a current smoker with moderate ETOH use   She is on Rinvoq for arthritis She has inhalers and advair for ? COPD. BP being Rx with bystolic, lasix, valsartan-HCTZ.   ***  ROS All other systems reviewed and negative except as noted above  Past Medical History:  Diagnosis Date  . Anxiety   . Arrhythmia   . Diverticulitis   . Herpes    chronic  . Hypertension   . IBS (irritable bowel syndrome)   . Obesity   . Rheumatoid arthritis (HCC)   . Smoker    Chronic    Family History  Problem Relation Age of Onset  . Cancer Mother   . Atrial fibrillation Mother   . Breast cancer Mother     Social History   Socioeconomic History  . Marital status: Married    Spouse name: Not on file  . Number of children: Not on file  . Years of education: Not on file  . Highest education level: Not on file  Occupational History  . Not on file  Tobacco Use  . Smoking status: Current Every Day Smoker    Packs/day: 1.00    Years: 22.00    Pack years: 22.00    Types: Cigarettes  . Smokeless tobacco: Never Used  Vaping Use  . Vaping Use: Never used  Substance and Sexual Activity  . Alcohol use: Yes    Comment: occasionally  . Drug use: No  . Sexual activity: Not on file  Other Topics  Concern  . Not on file  Social History Narrative  . Not on file   Social Determinants of Health   Financial Resource Strain: Not on file  Food Insecurity: Not on file  Transportation Needs: Not on file  Physical Activity: Not on file  Stress: Not on file  Social Connections: Not on file  Intimate Partner Violence: Not on file    Past Surgical History:  Procedure Laterality Date  . ABLATION    . LAPAROSCOPIC ASSISTED VAGINAL HYSTERECTOMY        Current Outpatient Medications:  .  Cyanocobalamin (B-12) 5000 MCG CAPS, Take 1 tablet by mouth daily., Disp: , Rfl:  .  HYDROcodone-acetaminophen (NORCO/VICODIN) 5-325 MG tablet, Take 1 tablet by mouth every 4 (four) hours as needed. (Patient not taking: Reported on 12/27/2016), Disp: 20 tablet, Rfl: 0 .  hydroxychloroquine (PLAQUENIL) 200 MG tablet, Take 200 mg by mouth 2 (two) times daily. DOES NOT TAKE ON SATURDAYS AND SUNDAYS, Disp: , Rfl:  .  hydroxychloroquine (PLAQUENIL) 200 MG tablet, Plaquenil 200 mg twice a day Monday through Friday only (Patient not taking: Reported on 05/02/2017), Disp: 135 tablet, Rfl: 1 .  ibuprofen (ADVIL,MOTRIN) 200 MG tablet, Take 200 mg by mouth every 6 (six) hours as needed., Disp: , Rfl:  .  nebivolol (BYSTOLIC) 5 MG tablet, Take 2.5 mg by mouth daily. , Disp: , Rfl:  .  nitrofurantoin, macrocrystal-monohydrate, (MACROBID) 100 MG capsule, Take 1 capsule (100 mg total) by mouth 2 (two) times daily. X 7 days (Patient not taking: Reported on 12/27/2016), Disp: 6 capsule, Rfl: 0 .  ondansetron (ZOFRAN) 4 MG tablet, Take 1 tablet (4 mg total) by mouth every 6 (six) hours. (Patient not taking: Reported on 12/27/2016), Disp: 12 tablet, Rfl: 0 .  traMADol (ULTRAM) 50 MG tablet, Take 1 tablet (50 mg total) by mouth every 6 (six) hours as needed. (Patient not taking: Reported on 12/27/2016), Disp: 15 tablet, Rfl: 0 .  tranexamic acid (LYSTEDA) 650 MG TABS tablet, Take 1,300 mg by mouth 3 (three) times daily., Disp: , Rfl:  .   valACYclovir (VALTREX) 1000 MG tablet, Take 0.5 tablets by mouth at bedtime. , Disp: , Rfl:  .  valsartan-hydrochlorothiazide (DIOVAN-HCT) 160-12.5 MG per tablet, Take 0.5 tablets by mouth daily. , Disp: , Rfl:     Physical Exam: There were no vitals taken for this visit.    Affect appropriate Healthy:  appears stated age HEENT: normal Neck supple with no adenopathy JVP normal no bruits no thyromegaly Lungs clear with no wheezing and good diaphragmatic motion Heart:  S1/S2 no murmur, no rub, gallop or click PMI normal Abdomen: benighn, BS positve, no tenderness, no AAA no bruit.  No HSM or HJR Distal pulses intact with no bruits No edema Neuro non-focal Skin warm and dry No muscular weakness   Labs:   Lab Results  Component Value Date   WBC 7.9 09/24/2016   HGB 14.8 09/24/2016   HCT 43.0 09/24/2016   MCV 94.5 09/24/2016   PLT 251 09/24/2016   No results for input(s): NA, K, CL, CO2, BUN, CREATININE, CALCIUM, PROT, BILITOT, ALKPHOS, ALT, AST, GLUCOSE in the last 168 hours.  Invalid input(s): LABALBU Lab Results  Component Value Date   TROPONINI <0.30 04/13/2014   No results found for: CHOL No results found for: HDL No results found for: LDLCALC No results found for: TRIG No results found for: CHOLHDL No results found for: LDLDIRECT    Radiology: DG Chest 2 View  Result Date: 01/07/2021 CLINICAL DATA:  50 year old female with cough EXAM: CHEST - 2 VIEW COMPARISON:  04/13/2014 FINDINGS: Cardiomediastinal silhouette unchanged in size and contour. Low lung volumes. No evidence of central vascular congestion. No interlobular septal thickening. No pneumothorax or pleural effusion. No confluent airspace disease. No displaced fracture. IMPRESSION: Low lung volumes without evidence of acute cardiopulmonary disease. Electronically Signed   By: Gilmer Mor D.O.   On: 01/07/2021 13:08   DG Ribs Unilateral W/Chest Left  Result Date: 01/29/2021 CLINICAL DATA:  COVID-19.   Cough. EXAM: LEFT RIBS AND CHEST - 3+ VIEW COMPARISON:  January 07, 2021 FINDINGS: Mild opacity in the left lateral base, similar in the interval, likely scarring. The heart, hila, mediastinum, lungs, and pleura are otherwise unremarkable. No pneumothorax. No rib fractures are identified. IMPRESSION: No acute abnormalities. No rib fractures. Mild opacity in left base is stable, likely scarring or chronic atelectasis. Electronically Signed   By: Gerome Sam III M.D   On: 01/29/2021 15:49    EKG: ***   ASSESSMENT AND PLAN:   1. Chest Pain:  *** 2. Palpitations: *** 3. Anxiety:  *** 4. HTN:  *** 5. RA:  On plaquenil f/u Deveshwar 6. Smoking:  Counseled on cessation < 10 minutes   ***  Signed: Charlton Haws  02/01/2021, 12:59 PM

## 2021-02-09 ENCOUNTER — Ambulatory Visit: Payer: 59 | Admitting: Cardiovascular Disease

## 2021-05-31 ENCOUNTER — Ambulatory Visit
Admission: RE | Admit: 2021-05-31 | Discharge: 2021-05-31 | Disposition: A | Discharge status: Home / Self Care | Payer: 59 | Source: Ambulatory Visit | Attending: Internal Medicine | Admitting: Internal Medicine

## 2021-05-31 ENCOUNTER — Other Ambulatory Visit: Payer: Self-pay

## 2021-05-31 ENCOUNTER — Other Ambulatory Visit: Payer: Self-pay | Admitting: Internal Medicine

## 2021-05-31 DIAGNOSIS — Z139 Encounter for screening, unspecified: Secondary | ICD-10-CM

## 2021-06-03 ENCOUNTER — Other Ambulatory Visit: Payer: Self-pay | Admitting: Internal Medicine

## 2021-06-03 DIAGNOSIS — R928 Other abnormal and inconclusive findings on diagnostic imaging of breast: Secondary | ICD-10-CM

## 2021-06-25 ENCOUNTER — Ambulatory Visit: Payer: 59

## 2021-06-25 ENCOUNTER — Ambulatory Visit
Admission: RE | Admit: 2021-06-25 | Discharge: 2021-06-25 | Disposition: A | Discharge status: Home / Self Care | Payer: 59 | Source: Ambulatory Visit | Attending: Internal Medicine | Admitting: Internal Medicine

## 2021-06-25 ENCOUNTER — Other Ambulatory Visit: Payer: Self-pay

## 2021-06-25 DIAGNOSIS — R928 Other abnormal and inconclusive findings on diagnostic imaging of breast: Secondary | ICD-10-CM

## 2021-12-22 ENCOUNTER — Other Ambulatory Visit: Payer: Self-pay

## 2021-12-22 ENCOUNTER — Encounter: Payer: Self-pay | Admitting: Podiatry

## 2021-12-22 ENCOUNTER — Ambulatory Visit (INDEPENDENT_AMBULATORY_CARE_PROVIDER_SITE_OTHER): Payer: 59 | Admitting: Podiatry

## 2021-12-22 DIAGNOSIS — L608 Other nail disorders: Secondary | ICD-10-CM

## 2021-12-22 DIAGNOSIS — L600 Ingrowing nail: Secondary | ICD-10-CM

## 2021-12-22 DIAGNOSIS — Z6836 Body mass index (BMI) 36.0-36.9, adult: Secondary | ICD-10-CM | POA: Insufficient documentation

## 2021-12-22 NOTE — Progress Notes (Signed)
°  Subjective:  Patient ID: Kayla Olson, female    DOB: 02-16-1971,   MRN: [id number]  Chief Complaint  Patient presents with   Nail Problem    Rt hallux pinched nail -x 9 yrs - had it removed once but came/grew back the same - w/ redness,swelling and draiange at Lateral side - very sensitive Tx; none -pcp Rx'd 1 round of abx    50 y.o. female presents for concern of right hallux pincer nail and ingrown. She relates she had the nail removed 9 years ago but it grew back the same and she recently has had increased pain redness and swelling. Her PCP prescribed antibiotics which helped  but hoping to have the whole nail permanently removed today . Denies any other pedal complaints. Denies n/v/f/c.   Past Medical History:  Diagnosis Date   Anxiety    Arrhythmia    Diverticulitis    Herpes    chronic   Hypertension    IBS (irritable bowel syndrome)    Obesity    Rheumatoid arthritis (HCC)    Smoker    Chronic    Objective:  Physical Exam: Vascular: DP/PT pulses 2/4 bilateral. CFT <3 seconds. Normal hair growth on digits. No edema.  Skin. No lacerations or abrasions bilateral feet. Pincer nail deformity of right hallux with mild erythema and edema surrounding medial and lateral nail borders. No purulence noted.  Musculoskeletal: MMT 5/5 bilateral lower extremities in DF, PF, Inversion and Eversion. Deceased ROM in DF of ankle joint.  Neurological: Sensation intact to light touch.   Assessment:   1. Ingrown nail of great toe of right foot   2. Pincer nail deformity      Plan:  Patient was evaluated and treated and all questions answered. Patient requesting removal of ingrown nail today. Procedure below.  Discussed procedure and post procedure care and patient expressed understanding.  Will follow-up in 2 weeks for nail check or sooner if any problems arise.    Procedure:  Procedure: total Nail Avulsion of right hallux.  Surgeon: Louann Sjogren, DPM  Pre-op Dx: Ingrown  toenail with infection Post-op: Same  Place of Surgery: Office exam room.  Indications for surgery: Painful and ingrown toenail.    The patient is requesting removal of nail with chemical matrixectomy. Risks and complications were discussed with the patient for which they understand and written consent was obtained. Under sterile conditions a total of 3 mL of 1:1 mixture 0.5% marcaine plain and 1% lidocaine plain was infiltrated in a hallux block fashion. Once anesthetized, the skin was prepped in sterile fashion. A tourniquet was then applied. Next the right hallux nail was removed. Marland Kitchen  Next phenol was then applied under standard conditions and copiously irrigated. Silvadene was applied. A dry sterile dressing was applied. After application of the dressing the tourniquet was removed and there is found to be an immediate capillary refill time to the digit. The patient tolerated the procedure well without any complications. Post procedure instructions were discussed the patient for which he verbally understood. Follow-up in two weeks for nail check or sooner if any problems are to arise. Discussed signs/symptoms of infection and directed to call the office immediately should any occur or go directly to the emergency room. In the meantime, encouraged to call the office with any questions, concerns, changes symptoms.   Louann Sjogren, DPM

## 2021-12-22 NOTE — Patient Instructions (Signed)

## 2021-12-29 ENCOUNTER — Emergency Department (HOSPITAL_COMMUNITY): Payer: 59

## 2021-12-29 ENCOUNTER — Other Ambulatory Visit: Payer: Self-pay

## 2021-12-29 ENCOUNTER — Encounter (HOSPITAL_COMMUNITY): Payer: Self-pay | Admitting: Emergency Medicine

## 2021-12-29 ENCOUNTER — Emergency Department (HOSPITAL_COMMUNITY)
Admission: EM | Admit: 2021-12-29 | Discharge: 2021-12-29 | Disposition: A | Discharge status: Home / Self Care | Payer: 59 | Attending: Emergency Medicine | Admitting: Emergency Medicine

## 2021-12-29 DIAGNOSIS — L03115 Cellulitis of right lower limb: Secondary | ICD-10-CM | POA: Diagnosis not present

## 2021-12-29 DIAGNOSIS — R600 Localized edema: Secondary | ICD-10-CM | POA: Diagnosis not present

## 2021-12-29 DIAGNOSIS — M25571 Pain in right ankle and joints of right foot: Secondary | ICD-10-CM | POA: Diagnosis not present

## 2021-12-29 DIAGNOSIS — M7989 Other specified soft tissue disorders: Secondary | ICD-10-CM

## 2021-12-29 DIAGNOSIS — M79671 Pain in right foot: Secondary | ICD-10-CM | POA: Diagnosis not present

## 2021-12-29 LAB — CBC WITH DIFFERENTIAL/PLATELET
Abs Immature Granulocytes: 0.03 10*3/uL (ref 0.00–0.07)
Basophils Absolute: 0.1 10*3/uL (ref 0.0–0.1)
Basophils Relative: 1 %
Eosinophils Absolute: 0.1 10*3/uL (ref 0.0–0.5)
Eosinophils Relative: 1 %
HCT: 40.4 % (ref 36.0–46.0)
Hemoglobin: 13.3 g/dL (ref 12.0–15.0)
Immature Granulocytes: 0 %
Lymphocytes Relative: 26 %
Lymphs Abs: 2.1 10*3/uL (ref 0.7–4.0)
MCH: 32.7 pg (ref 26.0–34.0)
MCHC: 32.9 g/dL (ref 30.0–36.0)
MCV: 99.3 fL (ref 80.0–100.0)
Monocytes Absolute: 0.9 10*3/uL (ref 0.1–1.0)
Monocytes Relative: 11 %
Neutro Abs: 4.9 10*3/uL (ref 1.7–7.7)
Neutrophils Relative %: 61 %
Platelets: 208 10*3/uL (ref 150–400)
RBC: 4.07 MIL/uL (ref 3.87–5.11)
RDW: 13.1 % (ref 11.5–15.5)
WBC: 8 10*3/uL (ref 4.0–10.5)
nRBC: 0 % (ref 0.0–0.2)

## 2021-12-29 LAB — BASIC METABOLIC PANEL
Anion gap: 7 (ref 5–15)
BUN: 16 mg/dL (ref 6–20)
CO2: 25 mmol/L (ref 22–32)
Calcium: 9 mg/dL (ref 8.9–10.3)
Chloride: 105 mmol/L (ref 98–111)
Creatinine, Ser: 0.96 mg/dL (ref 0.44–1.00)
GFR, Estimated: >60 mL/min (ref >60)
Glucose, Bld: 102 mg/dL — ABNORMAL HIGH (ref 70–99)
Potassium: 4.3 mmol/L (ref 3.5–5.1)
Sodium: 137 mmol/L (ref 135–145)

## 2021-12-29 MED ORDER — HYDROCODONE-ACETAMINOPHEN 5-325 MG PO TABS: 1.0000 | ORAL_TABLET | ORAL | 0 refills | Status: DC | PRN

## 2021-12-29 MED ORDER — KETOROLAC TROMETHAMINE 30 MG/ML IJ SOLN
30.0000 mg | Freq: Once | INTRAMUSCULAR | Status: AC
  Administered 2021-12-29: 30 mg via INTRAVENOUS
  Filled 2021-12-29: qty 1

## 2021-12-29 MED ORDER — SULFAMETHOXAZOLE-TRIMETHOPRIM 800-160 MG PO TABS
1.0000 | ORAL_TABLET | Freq: Two times a day (BID) | ORAL | 0 refills | Status: AC
Start: 2021-12-29 — End: 2022-01-05

## 2021-12-29 MED ORDER — IBUPROFEN 600 MG PO TABS: 600.0000 mg | ORAL_TABLET | Freq: Four times a day (QID) | ORAL | 0 refills | Status: AC | PRN

## 2021-12-29 MED ORDER — VANCOMYCIN HCL 2000 MG/400ML IV SOLN
2000.0000 mg | Freq: Once | INTRAVENOUS | Status: AC
  Administered 2021-12-29: 2000 mg via INTRAVENOUS
  Filled 2021-12-29: qty 400

## 2021-12-29 MED ORDER — CEFAZOLIN SODIUM-DEXTROSE 2-4 GM/100ML-% IV SOLN
2.0000 g | Freq: Once | INTRAVENOUS | Status: AC
  Administered 2021-12-29: 2 g via INTRAVENOUS
  Filled 2021-12-29: qty 100

## 2021-12-29 NOTE — ED Triage Notes (Signed)
Right foot/ankle pain and swelling since Monday. Pt states she had toenail removed last week. Pt denies any injury.

## 2021-12-29 NOTE — Discharge Instructions (Addendum)
Hold Humira until antibiotic course is complete.

## 2021-12-29 NOTE — ED Provider Notes (Addendum)
[facility] EMERGENCY DEPARTMENT Provider Note   CSN: [id number] Arrival date & time: 12/29/21  [id number]     History  Chief Complaint  Patient presents with   Foot Pain    Kayla Olson is a 51 y.o. female.  Pt presents to the ED today with right foot redness and swelling since Monday (1/2).  Pt had a toenail removed last week (12/28) by podiatry for an ingrown toe nail.  Her pcp had put her on Augmentin prior to the nail getting removed.  Pt has not taken any abx since then.  Redness and swelling is now traveling up her right leg.  Pt is on Humira for RA.  No fevers or chills.  No sob or cp.      Home Medications Prior to Admission medications   Medication Sig Start Date End Date Taking? Authorizing Provider  sulfamethoxazole-trimethoprim (BACTRIM DS) 800-160 MG tablet Take 1 tablet by mouth 2 (two) times daily for 7 days. 12/29/21 01/05/22 Yes Isla Pence, MD  Adalimumab (HUMIRA PEN) 40 MG/0.4ML PNKT     [provider]  nebivolol (BYSTOLIC) 5 MG tablet Take 2.5 mg by mouth daily.     [provider]  valsartan (DIOVAN) 160 MG tablet Take 160 mg by mouth daily. 12/07/21   [provider]      Allergies    Codeine and Shellfish allergy    Review of Systems   Review of Systems  Skin:  Positive for color change.  All other systems reviewed and are negative.  Physical Exam Updated Vital Signs BP (!) 193/98 Comment: has not had am medications   Pulse 60    Temp 97.9 F (36.6 C) (Oral)    Resp 18    Ht 5\' 4"  (1.626 m)    Wt 95.3 kg    SpO2 98%    BMI 36.05 kg/m  Physical Exam Vitals and nursing note reviewed.  Constitutional:      Appearance: Normal appearance.  HENT:     Head: Normocephalic and atraumatic.     Right Ear: External ear normal.     Left Ear: External ear normal.     Nose: Nose normal.     Mouth/Throat:     Mouth: Mucous membranes are moist.     Pharynx: Oropharynx is clear.  Eyes:     Extraocular Movements: Extraocular  movements intact.     Conjunctiva/sclera: Conjunctivae normal.     Pupils: Pupils are equal, round, and reactive to light.  Cardiovascular:     Rate and Rhythm: Normal rate and regular rhythm.     Pulses: Normal pulses.     Heart sounds: Normal heart sounds.  Pulmonary:     Effort: Pulmonary effort is normal.     Breath sounds: Normal breath sounds.  Abdominal:     General: Abdomen is flat. Bowel sounds are normal.     Palpations: Abdomen is soft.  Musculoskeletal:        General: Normal range of motion.     Cervical back: Normal range of motion and neck supple.  Skin:    Capillary Refill: Capillary refill takes less than 2 seconds.     Comments: Right great toe nailbed does not look infected.  Right foot redness and swelling.  Some lymphangitis starting.  Neurological:     General: No focal deficit present.     Mental Status: She is alert and oriented to person, place, and time.  Psychiatric:  Mood and Affect: Mood normal.        Behavior: Behavior normal.    ED Results / Procedures / Treatments   Labs (all labs ordered are listed, but only abnormal results are displayed) Labs Reviewed  BASIC METABOLIC PANEL - Abnormal; Notable for the following components:      Result Value   Glucose, Bld 102 (*)    All other components within normal limits  CBC WITH DIFFERENTIAL/PLATELET    EKG None  Radiology DG Ankle Complete Right  Result Date: 12/29/2021 CLINICAL DATA:  Right foot and ankle pain. EXAM: RIGHT ANKLE - COMPLETE 3+ VIEW COMPARISON:  None. FINDINGS: There is no evidence of fracture, dislocation, or joint effusion. There is no evidence of arthropathy or other focal bone abnormality. Soft tissues are unremarkable. IMPRESSION: Negative. Electronically Signed   By: Misty Stanley M.D.   On: 12/29/2021 07:11   US Venous Img Lower Unilateral Right  Result Date: 12/29/2021 CLINICAL DATA:  Right foot pain and edema for 2 days. EXAM: RIGHT LOWER EXTREMITY VENOUS DOPPLER  ULTRASOUND TECHNIQUE: Gray-scale sonography with graded compression, as well as color Doppler and duplex ultrasound were performed to evaluate the lower extremity deep venous systems from the level of the common femoral vein and including the common femoral, femoral, profunda femoral, popliteal and calf veins including the posterior tibial, peroneal and gastrocnemius veins when visible. The superficial great saphenous vein was also interrogated. Spectral Doppler was utilized to evaluate flow at rest and with distal augmentation maneuvers in the common femoral, femoral and popliteal veins. COMPARISON:  None. FINDINGS: Contralateral Common Femoral Vein: Respiratory phasicity is normal and symmetric with the symptomatic side. No evidence of thrombus. Normal compressibility. Common Femoral Vein: No evidence of thrombus. Normal compressibility, respiratory phasicity and response to augmentation. Saphenofemoral Junction: No evidence of thrombus. Normal compressibility and flow on color Doppler imaging. Profunda Femoral Vein: No evidence of thrombus. Normal compressibility and flow on color Doppler imaging. Femoral Vein: No evidence of thrombus. Normal compressibility, respiratory phasicity and response to augmentation. Popliteal Vein: No evidence of thrombus. Normal compressibility, respiratory phasicity and response to augmentation. Calf Veins: No evidence of thrombus. Normal compressibility and flow on color Doppler imaging. Superficial Great Saphenous Vein: No evidence of thrombus. Normal compressibility. Other Findings:  None. IMPRESSION: Negative for deep venous thrombosis in right lower extremity. Electronically Signed   By: Markus Daft M.D.   On: 12/29/2021 10:44   DG Foot Complete Right  Result Date: 12/29/2021 CLINICAL DATA:  Right foot and ankle pain. EXAM: RIGHT FOOT COMPLETE - 3+ VIEW COMPARISON:  None. FINDINGS: There is no evidence of fracture or dislocation. There is no evidence of arthropathy or other  focal bone abnormality. Soft tissues are unremarkable. IMPRESSION: Negative. Electronically Signed   By: Misty Stanley M.D.   On: 12/29/2021 07:10    Procedures Procedures    Medications Ordered in ED Medications  vancomycin (VANCOREADY) IVPB 2000 mg/400 mL (2,000 mg Intravenous New Bag/Given 12/29/21 0934)  ketorolac (TORADOL) 30 MG/ML injection 30 mg (30 mg Intravenous Given 12/29/21 0852)  ceFAZolin (ANCEF) IVPB 2g/100 mL premix (0 g Intravenous Stopped 12/29/21 0931)    ED Course/ Medical Decision Making/ A&P                           Medical Decision Making  Xrays and labs reviewed.  No evidence of osteo or fx.  Labs are ok.  [place] neg for dvt.  Due to  the cellulitis and the start of lymphangitis, pt given vanc and ancef.  She is told to hold off on the Humira until her antibiotics are completed.  Pt shows no signs of sepsis and is stable for d/c home.  She knows to return if her cellulitis progresses.        Final Clinical Impression(s) / ED Diagnoses Final diagnoses:  Cellulitis of right lower extremity    Rx / DC Orders ED Discharge Orders          Ordered    sulfamethoxazole-trimethoprim (BACTRIM DS) 800-160 MG tablet  2 times daily        12/29/21 1053              Isla Pence, MD 12/29/21 1053    Isla Pence, MD 12/29/21 1054

## 2022-01-05 ENCOUNTER — Ambulatory Visit: Payer: Self-pay | Admitting: Podiatry

## 2022-01-10 ENCOUNTER — Ambulatory Visit: Payer: Self-pay | Admitting: Podiatry

## 2022-01-12 ENCOUNTER — Ambulatory Visit: Payer: 59 | Admitting: Podiatry

## 2022-01-12 ENCOUNTER — Other Ambulatory Visit: Payer: Self-pay

## 2022-01-12 ENCOUNTER — Encounter: Payer: Self-pay | Admitting: Podiatry

## 2022-01-12 DIAGNOSIS — L600 Ingrowing nail: Secondary | ICD-10-CM

## 2022-01-12 NOTE — Progress Notes (Signed)
°  Subjective:  Patient ID: Kayla Olson, female    DOB: [birth year],   MRN: [id number]  Chief Complaint  Patient presents with   Toe Pain    Pt is here to f/u right great toe nail check. Pt reports "its much better"    51 y.o. female presents for follow up of great toenail removal. Relates she did have an infection and was seen in the ED and put on oral and IV antibiotics. Relates it is doing much better. States she was on Humira which increased her infection severity. Doing well today. Still soaking and doing bandaids.  . Denies any other pedal complaints. Denies n/v/f/c.   Past Medical History:  Diagnosis Date   Anxiety    Arrhythmia    Diverticulitis    Herpes    chronic   Hypertension    IBS (irritable bowel syndrome)    Obesity    Rheumatoid arthritis (HCC)    Smoker    Chronic    Objective:  Physical Exam: Vascular: DP/PT pulses 2/4 bilateral. CFT <3 seconds. Normal hair growth on digits. No edema.  Skin. No lacerations or abrasions bilateral feet. Right hallux nail bed appears to be well healed. No erythema edema or purulence noted. Musculoskeletal: MMT 5/5 bilateral lower extremities in DF, PF, Inversion and Eversion. Deceased ROM in DF of ankle joint.  Neurological: Sensation intact to light touch.   Assessment:   1. Ingrown nail of great toe of right foot      Plan:  Patient was evaluated and treated and all questions answered. Toe was evaluated and appears to be healing well.  May continue her Humira. Does not appear to have any infection.  May discontinue soaks and neosporin.  Patient to follow-up as needed.    Louann Sjogren, DPM

## 2022-01-17 ENCOUNTER — Ambulatory Visit: Payer: Self-pay | Admitting: Podiatry

## 2022-04-06 ENCOUNTER — Encounter: Payer: Self-pay | Admitting: Podiatry

## 2022-04-06 ENCOUNTER — Ambulatory Visit: Payer: 59 | Admitting: Podiatry

## 2022-04-06 DIAGNOSIS — L600 Ingrowing nail: Secondary | ICD-10-CM | POA: Diagnosis not present

## 2022-04-06 MED ORDER — CEPHALEXIN 500 MG PO CAPS: 500.0000 mg | ORAL_CAPSULE | Freq: Four times a day (QID) | ORAL | 0 refills | Status: AC

## 2022-04-06 NOTE — Patient Instructions (Signed)

## 2022-04-06 NOTE — Progress Notes (Signed)
?  Subjective:  ?Patient ID: Kayla Olson, female    DOB: [birth year],   MRN: [id number] ? ?No chief complaint on file. ? ? ?51 y.o. female presents for concern of left great ingrown toenail.  Relates she tried to dig it out but has still been sore. This has been going on for years. . Denies any other pedal complaints. Denies n/v/f/c.  ? ?Past Medical History:  ?Diagnosis Date  ? Anxiety   ? Arrhythmia   ? Diverticulitis   ? Herpes   ? chronic  ? Hypertension   ? IBS (irritable bowel syndrome)   ? Obesity   ? Rheumatoid arthritis (HCC)   ? Smoker   ? Chronic  ? ? ?Objective:  ?Physical Exam: ?Vascular: DP/PT pulses 2/4 bilateral. CFT <3 seconds. Normal hair growth on digits. No edema.  ?Skin. No lacerations or abrasions bilateral feet. Incurvation of lateral border of left hallux. Mild edema and erythema noted. No purulence noted.  ?Musculoskeletal: MMT 5/5 bilateral lower extremities in DF, PF, Inversion and Eversion. Deceased ROM in DF of ankle joint.  ?Neurological: Sensation intact to light touch.  ? ?Assessment:  ? ?1. Ingrown nail of great toe of left foot   ? ? ? ?Plan:  ?Patient was evaluated and treated and all questions answered. ?Patient requesting removal of ingrown nail today. Procedure below.  ?Discussed procedure and post procedure care and patient expressed understanding.  ?Keflex sent to pharmacy.  ?Will follow-up in 2 weeks for nail check or sooner if any problems arise.  ? ? ?Procedure:  ?Procedure: total Nail Avulsion of left hallux nail  ?Surgeon: Louann Sjogren, DPM  ?Pre-op Dx: Ingrown toenail without infection ?Post-op: Same  ?Place of Surgery: Office exam room.  ?Indications for surgery: Painful and ingrown toenail.  ? ? ?The patient is requesting removal of nail with chemical matrixectomy. Risks and complications were discussed with the patient for which they understand and written consent was obtained. Under sterile conditions a total of 3 mL of  1% lidocaine plain was infiltrated in a  hallux block fashion. Once anesthetized, the skin was prepped in sterile fashion. A tourniquet was then applied. Next the entire left hallux nail was removed. Marland Kitchen  Next phenol was then applied under standard conditions and copiously irrigated. Silvadene was applied. A dry sterile dressing was applied. After application of the dressing the tourniquet was removed and there is found to be an immediate capillary refill time to the digit. The patient tolerated the procedure well without any complications. Post procedure instructions were discussed the patient for which he verbally understood. Follow-up in two weeks for nail check or sooner if any problems are to arise. Discussed signs/symptoms of infection and directed to call the office immediately should any occur or go directly to the emergency room. In the meantime, encouraged to call the office with any questions, concerns, changes symptoms. ? ? ? ?Louann Sjogren, DPM  ? ? ?

## 2022-04-20 ENCOUNTER — Encounter: Payer: Self-pay | Admitting: Podiatry

## 2022-04-20 ENCOUNTER — Ambulatory Visit (INDEPENDENT_AMBULATORY_CARE_PROVIDER_SITE_OTHER): Payer: 59 | Admitting: Podiatry

## 2022-04-20 DIAGNOSIS — Z79899 Other long term (current) drug therapy: Secondary | ICD-10-CM | POA: Diagnosis not present

## 2022-04-20 DIAGNOSIS — L600 Ingrowing nail: Secondary | ICD-10-CM

## 2022-04-20 DIAGNOSIS — M0579 Rheumatoid arthritis with rheumatoid factor of multiple sites without organ or systems involvement: Secondary | ICD-10-CM | POA: Diagnosis not present

## 2022-04-20 DIAGNOSIS — R5383 Other fatigue: Secondary | ICD-10-CM | POA: Diagnosis not present

## 2022-04-20 DIAGNOSIS — R7989 Other specified abnormal findings of blood chemistry: Secondary | ICD-10-CM | POA: Diagnosis not present

## 2022-04-20 DIAGNOSIS — Z6835 Body mass index (BMI) 35.0-35.9, adult: Secondary | ICD-10-CM | POA: Diagnosis not present

## 2022-04-20 DIAGNOSIS — M25521 Pain in right elbow: Secondary | ICD-10-CM | POA: Diagnosis not present

## 2022-04-20 DIAGNOSIS — E669 Obesity, unspecified: Secondary | ICD-10-CM | POA: Diagnosis not present

## 2022-04-20 NOTE — Progress Notes (Signed)
?  Subjective:  ?Patient ID: KASHIRA BEHUNIN, female    DOB: 1971-10-25,   MRN: [id number] ? ?Chief Complaint  ?Patient presents with  ? Nail Problem  ?  Left foot nail check   ? ? ?51 y.o. female presents for follow-up of left hallux total nail avulsion. Relates doing well with no pain. Has been soaking and band aid.  . Denies any other pedal complaints. Denies n/v/f/c.  ? ?Past Medical History:  ?Diagnosis Date  ? Anxiety   ? Arrhythmia   ? Diverticulitis   ? Herpes   ? chronic  ? Hypertension   ? IBS (irritable bowel syndrome)   ? Obesity   ? Rheumatoid arthritis (HCC)   ? Smoker   ? Chronic  ? ? ?Objective:  ?Physical Exam: ?Vascular: DP/PT pulses 2/4 bilateral. CFT <3 seconds. Normal hair growth on digits. No edema.  ?Skin. No lacerations or abrasions bilateral feet. Left hallux nail bed well healed.  ?Musculoskeletal: MMT 5/5 bilateral lower extremities in DF, PF, Inversion and Eversion. Deceased ROM in DF of ankle joint.  ?Neurological: Sensation intact to light touch.  ? ?Assessment:  ? ?1. Ingrown nail of great toe of left foot   ? ? ? ?Plan:  ?Patient was evaluated and treated and all questions answered. ?Toe was evaluated and appears to be healing well.  ?May discontinue soaks and neosporin.  ?Patient to follow-up as needed.  ? ? ?Louann Sjogren, DPM  ? ? ?

## 2022-11-24 DIAGNOSIS — Z79899 Other long term (current) drug therapy: Secondary | ICD-10-CM | POA: Diagnosis not present

## 2022-11-24 DIAGNOSIS — R7989 Other specified abnormal findings of blood chemistry: Secondary | ICD-10-CM | POA: Diagnosis not present

## 2022-11-24 DIAGNOSIS — M25511 Pain in right shoulder: Secondary | ICD-10-CM | POA: Diagnosis not present

## 2022-11-24 DIAGNOSIS — M25521 Pain in right elbow: Secondary | ICD-10-CM | POA: Diagnosis not present

## 2022-11-24 DIAGNOSIS — R5383 Other fatigue: Secondary | ICD-10-CM | POA: Diagnosis not present

## 2022-11-24 DIAGNOSIS — Z6836 Body mass index (BMI) 36.0-36.9, adult: Secondary | ICD-10-CM | POA: Diagnosis not present

## 2022-11-24 DIAGNOSIS — E669 Obesity, unspecified: Secondary | ICD-10-CM | POA: Diagnosis not present

## 2022-11-24 DIAGNOSIS — M0579 Rheumatoid arthritis with rheumatoid factor of multiple sites without organ or systems involvement: Secondary | ICD-10-CM | POA: Diagnosis not present

## 2022-12-29 DIAGNOSIS — J029 Acute pharyngitis, unspecified: Secondary | ICD-10-CM | POA: Diagnosis not present

## 2023-03-22 ENCOUNTER — Other Ambulatory Visit (HOSPITAL_COMMUNITY): Payer: Self-pay | Admitting: Internal Medicine

## 2023-03-22 ENCOUNTER — Ambulatory Visit (HOSPITAL_COMMUNITY)
Admission: RE | Admit: 2023-03-22 | Discharge: 2023-03-22 | Disposition: A | Discharge status: Home / Self Care | Payer: 59 | Source: Ambulatory Visit | Attending: Internal Medicine | Admitting: Internal Medicine

## 2023-03-22 DIAGNOSIS — R0602 Shortness of breath: Secondary | ICD-10-CM

## 2023-03-22 DIAGNOSIS — R059 Cough, unspecified: Secondary | ICD-10-CM

## 2023-04-26 DIAGNOSIS — Z111 Encounter for screening for respiratory tuberculosis: Secondary | ICD-10-CM | POA: Diagnosis not present

## 2023-04-26 DIAGNOSIS — M0579 Rheumatoid arthritis with rheumatoid factor of multiple sites without organ or systems involvement: Secondary | ICD-10-CM | POA: Diagnosis not present

## 2023-04-26 DIAGNOSIS — Z79899 Other long term (current) drug therapy: Secondary | ICD-10-CM | POA: Diagnosis not present

## 2023-04-26 DIAGNOSIS — E669 Obesity, unspecified: Secondary | ICD-10-CM | POA: Diagnosis not present

## 2023-04-26 DIAGNOSIS — Z6836 Body mass index (BMI) 36.0-36.9, adult: Secondary | ICD-10-CM | POA: Diagnosis not present

## 2023-04-26 DIAGNOSIS — R7989 Other specified abnormal findings of blood chemistry: Secondary | ICD-10-CM | POA: Diagnosis not present

## 2023-04-26 DIAGNOSIS — M25511 Pain in right shoulder: Secondary | ICD-10-CM | POA: Diagnosis not present

## 2023-04-26 DIAGNOSIS — R5383 Other fatigue: Secondary | ICD-10-CM | POA: Diagnosis not present

## 2023-04-26 DIAGNOSIS — M25521 Pain in right elbow: Secondary | ICD-10-CM | POA: Diagnosis not present

## 2023-06-08 ENCOUNTER — Other Ambulatory Visit (HOSPITAL_COMMUNITY): Payer: Self-pay | Admitting: Internal Medicine

## 2023-06-08 DIAGNOSIS — Z1231 Encounter for screening mammogram for malignant neoplasm of breast: Secondary | ICD-10-CM

## 2023-06-15 ENCOUNTER — Encounter (HOSPITAL_COMMUNITY): Payer: Self-pay

## 2023-06-15 ENCOUNTER — Ambulatory Visit (HOSPITAL_COMMUNITY)
Admission: RE | Admit: 2023-06-15 | Discharge: 2023-06-15 | Disposition: A | Discharge status: Home / Self Care | Payer: 59 | Source: Ambulatory Visit | Attending: Internal Medicine | Admitting: Internal Medicine

## 2023-06-15 DIAGNOSIS — Z1231 Encounter for screening mammogram for malignant neoplasm of breast: Secondary | ICD-10-CM | POA: Diagnosis not present

## 2023-06-19 ENCOUNTER — Other Ambulatory Visit (HOSPITAL_COMMUNITY): Payer: Self-pay | Admitting: Internal Medicine

## 2023-06-19 DIAGNOSIS — R928 Other abnormal and inconclusive findings on diagnostic imaging of breast: Secondary | ICD-10-CM

## 2023-06-20 ENCOUNTER — Ambulatory Visit (HOSPITAL_COMMUNITY)
Admission: RE | Admit: 2023-06-20 | Discharge: 2023-06-20 | Disposition: A | Discharge status: Home / Self Care | Payer: 59 | Source: Ambulatory Visit | Attending: Internal Medicine | Admitting: Internal Medicine

## 2023-06-20 ENCOUNTER — Encounter (HOSPITAL_COMMUNITY): Payer: Self-pay

## 2023-06-20 DIAGNOSIS — N6489 Other specified disorders of breast: Secondary | ICD-10-CM | POA: Diagnosis not present

## 2023-06-20 DIAGNOSIS — R928 Other abnormal and inconclusive findings on diagnostic imaging of breast: Secondary | ICD-10-CM | POA: Diagnosis not present

## 2023-06-20 DIAGNOSIS — N6002 Solitary cyst of left breast: Secondary | ICD-10-CM | POA: Diagnosis not present

## 2023-08-24 DIAGNOSIS — Z79899 Other long term (current) drug therapy: Secondary | ICD-10-CM | POA: Diagnosis not present

## 2023-08-24 DIAGNOSIS — R5383 Other fatigue: Secondary | ICD-10-CM | POA: Diagnosis not present

## 2023-08-24 DIAGNOSIS — M0579 Rheumatoid arthritis with rheumatoid factor of multiple sites without organ or systems involvement: Secondary | ICD-10-CM | POA: Diagnosis not present

## 2023-09-15 DIAGNOSIS — E8940 Asymptomatic postprocedural ovarian failure: Secondary | ICD-10-CM | POA: Diagnosis not present

## 2023-09-21 DIAGNOSIS — M0579 Rheumatoid arthritis with rheumatoid factor of multiple sites without organ or systems involvement: Secondary | ICD-10-CM | POA: Diagnosis not present

## 2023-10-31 DIAGNOSIS — J069 Acute upper respiratory infection, unspecified: Secondary | ICD-10-CM | POA: Diagnosis not present

## 2023-11-02 ENCOUNTER — Other Ambulatory Visit (HOSPITAL_COMMUNITY): Payer: Self-pay | Admitting: Nurse Practitioner

## 2023-11-02 ENCOUNTER — Ambulatory Visit (HOSPITAL_COMMUNITY)
Admission: RE | Admit: 2023-11-02 | Discharge: 2023-11-02 | Disposition: A | Discharge status: Home / Self Care | Payer: 59 | Source: Ambulatory Visit | Attending: Nurse Practitioner | Admitting: Nurse Practitioner

## 2023-11-02 DIAGNOSIS — R0989 Other specified symptoms and signs involving the circulatory and respiratory systems: Secondary | ICD-10-CM | POA: Insufficient documentation

## 2023-11-02 DIAGNOSIS — R918 Other nonspecific abnormal finding of lung field: Secondary | ICD-10-CM | POA: Diagnosis not present

## 2023-11-02 DIAGNOSIS — R059 Cough, unspecified: Secondary | ICD-10-CM | POA: Diagnosis not present

## 2023-11-20 ENCOUNTER — Other Ambulatory Visit (HOSPITAL_COMMUNITY): Payer: Self-pay | Admitting: Nurse Practitioner

## 2023-11-20 ENCOUNTER — Ambulatory Visit (HOSPITAL_COMMUNITY)
Admission: RE | Admit: 2023-11-20 | Discharge: 2023-11-20 | Disposition: A | Discharge status: Home / Self Care | Payer: 59 | Source: Ambulatory Visit | Attending: Nurse Practitioner | Admitting: Nurse Practitioner

## 2023-11-20 DIAGNOSIS — R0989 Other specified symptoms and signs involving the circulatory and respiratory systems: Secondary | ICD-10-CM | POA: Diagnosis not present

## 2023-11-20 DIAGNOSIS — R5383 Other fatigue: Secondary | ICD-10-CM | POA: Diagnosis not present

## 2023-11-20 DIAGNOSIS — J189 Pneumonia, unspecified organism: Secondary | ICD-10-CM | POA: Insufficient documentation

## 2023-11-20 DIAGNOSIS — R059 Cough, unspecified: Secondary | ICD-10-CM | POA: Diagnosis not present

## 2023-11-20 IMAGING — DX DG ANKLE COMPLETE 3+V*R*
3 series · 3 of 3 positions shown · non-contrast
Comparison: None.

CLINICAL DATA: Right foot and ankle pain.

EXAM:
RIGHT ANKLE - COMPLETE 3+ VIEW

[ankle ap]
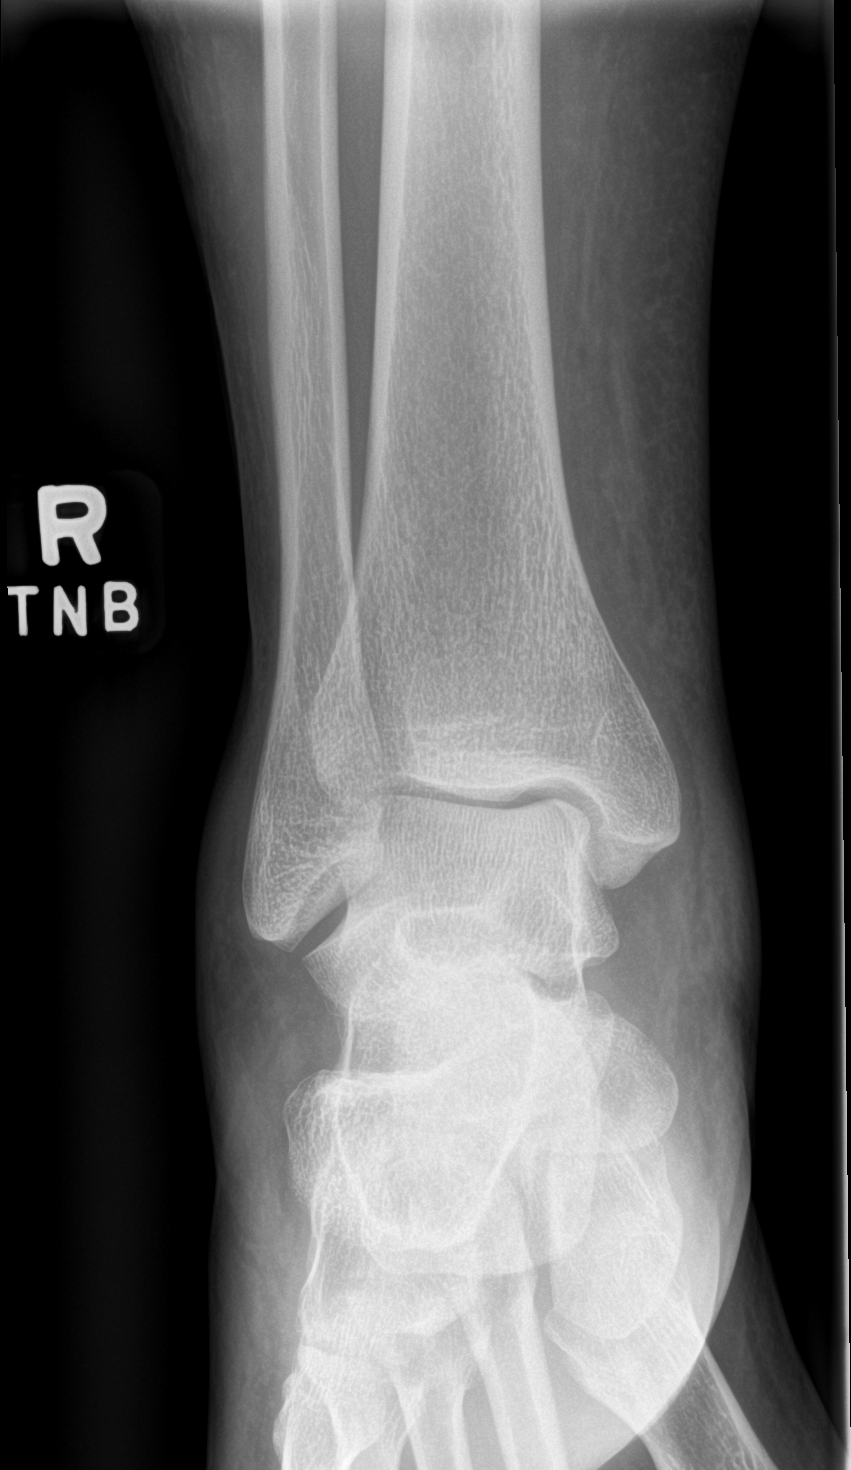

[ankle obl]
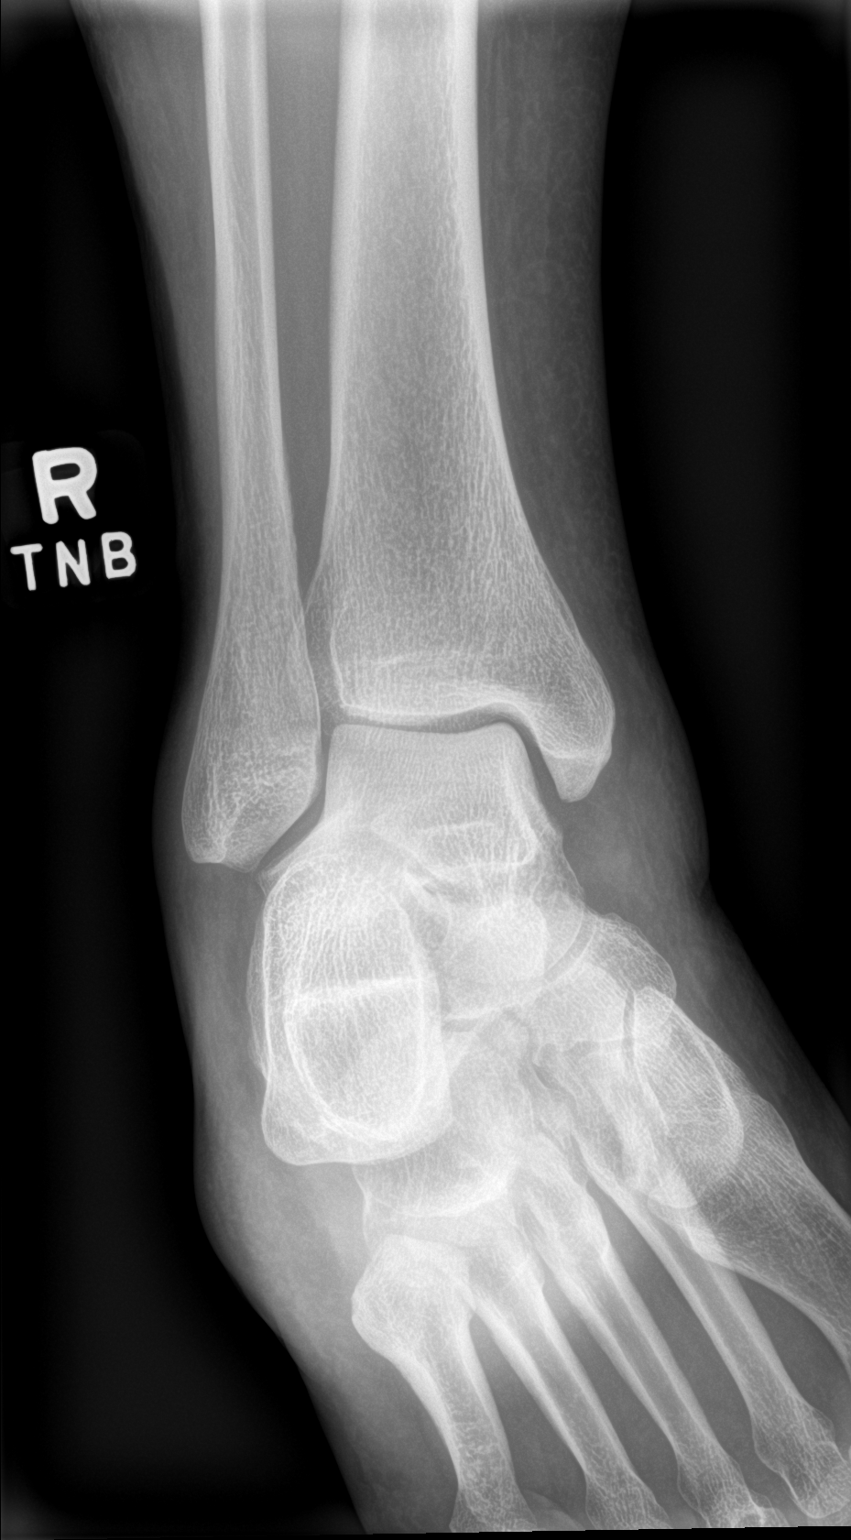

[ankle lat]
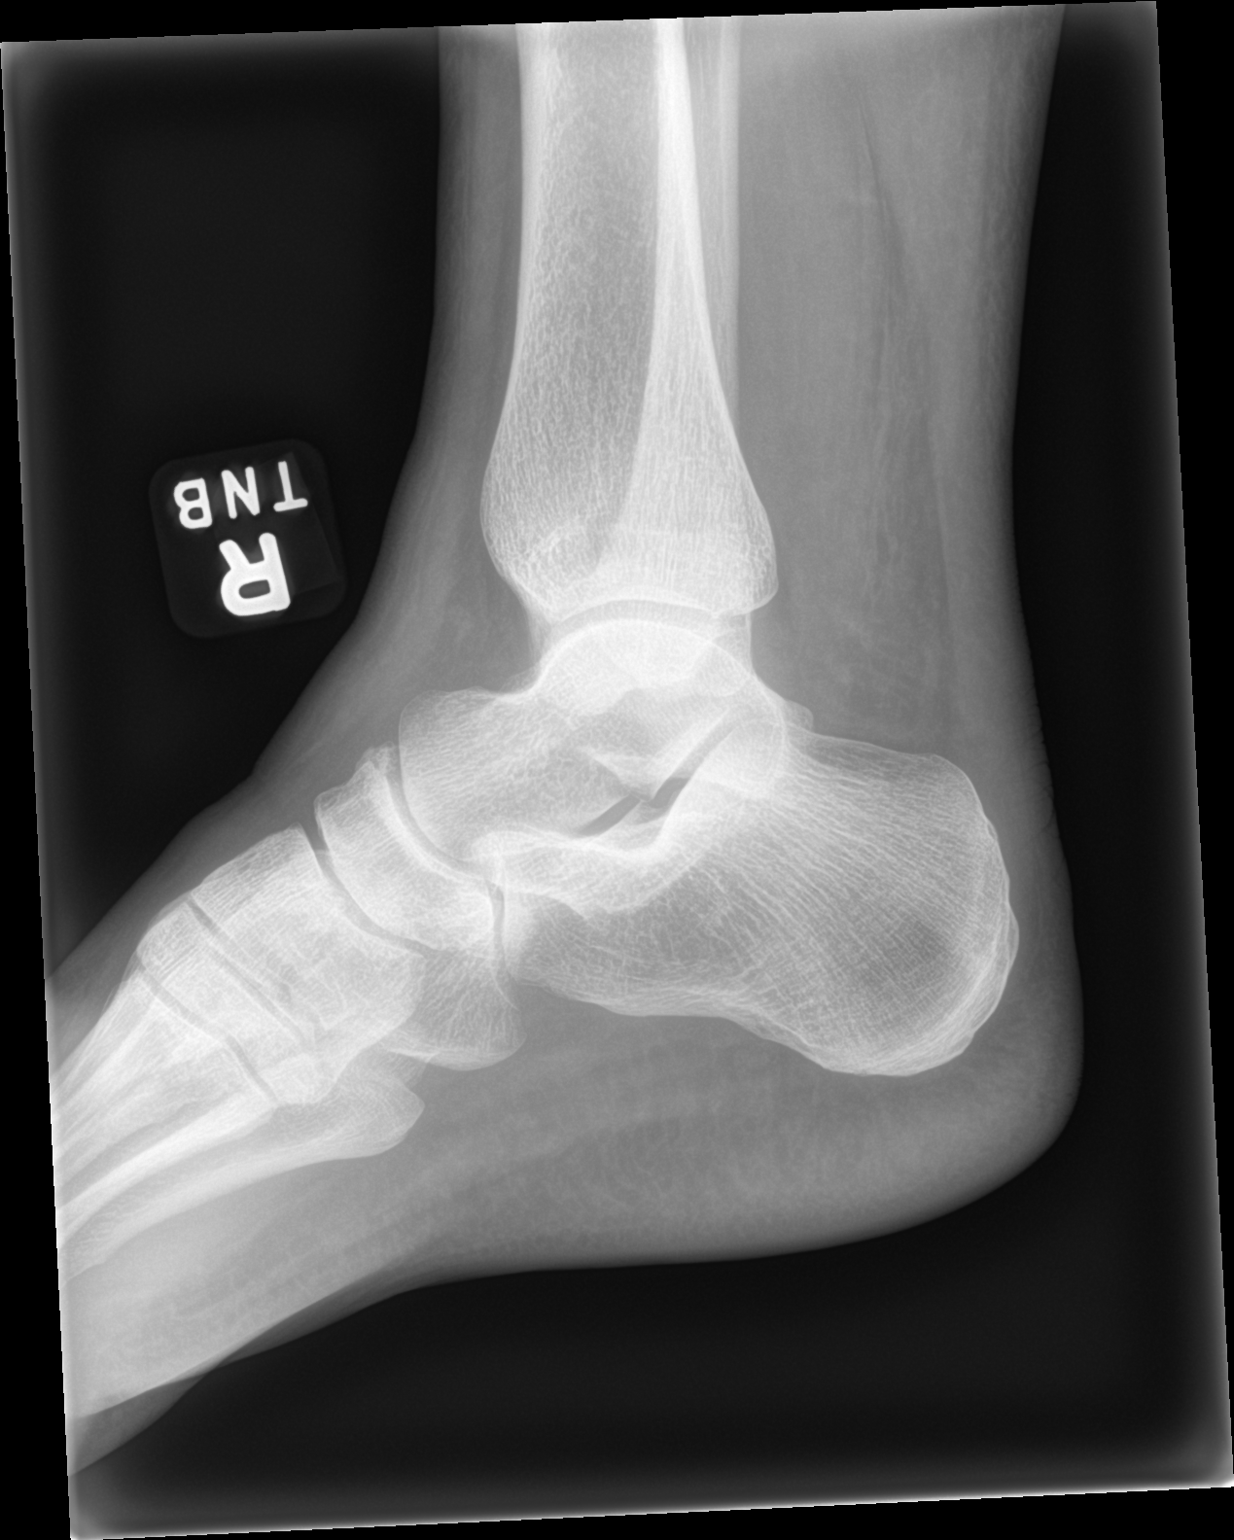

[3 of 3 positions shown; findings below may reference images not displayed]

FINDINGS: There is no evidence of fracture, dislocation, or joint effusion.
There is no evidence of arthropathy or other focal bone abnormality.
Soft tissues are unremarkable.
IMPRESSION: Negative.

## 2023-11-24 DIAGNOSIS — J189 Pneumonia, unspecified organism: Secondary | ICD-10-CM | POA: Diagnosis not present

## 2023-11-26 NOTE — Progress Notes (Unsigned)
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 x 1st humira worked great but developed sorethroat w/in a few days of taking injection q 2 weeks so stopped in March 2024 > worse RA then August 2024 rx sympony x 2 loading doses > last dose was end of October w/in a week developed sob/ cough/ nasty sinus drainage > flonase and new start on inhalers  referred to pulmonary clinic in [place]  11/27/2023 by Dr Sherril Croon  for ongoing doe and sometimes during coughing  fit non productive  / nasal drainge has turned clear/ was green worse in ams and overall 75% improved at time of ov but still tapering off prednisone     History of Present Illness  11/27/2023  Pulmonary/ 1st office eval/ Kayla [facility]ztri/ albuterol/ prednisone  10 mg bid  Chief Complaint  Patient presents with   Establish Care   Cough  Dyspnea:  no regular ex / min so unless  Cough: mostly dry p lie down  Sleep:  flat bed 2-3 pillows prefer R side down / can't breathe on L  SABA use: q 4h w/a  02: none  Lung cancer screen: none   No obvious day to day or daytime pattern/variability or assoc excess/ purulent sputum or mucus plugs or hemoptysis or cp or chest tightness, subjective wheeze or overt sinus or hb symptoms.    Also denies any obvious fluctuation of symptoms with weather or environmental changes or other aggravating or alleviating factors except as outlined above   No unusual exposure hx or h/o childhood pna/ asthma or knowledge of premature birth.  Current Allergies, Complete Past Medical History, Past Surgical History, Family History, and Social History were reviewed in [employer] record.  ROS  The following are not active complaints unless bolded Hoarseness, sore throat, dysphagia=mild globus sensation, dental problems, itching,  sneezing,  nasal congestion or discharge of excess mucus or purulent secretions, ear ache,   fever, chills, sweats, unintended wt loss or wt gain, classically pleuritic or exertional cp,  orthopnea pnd or arm/hand swelling  or leg swelling, presyncope, palpitations, abdominal pain, anorexia, nausea, vomiting, diarrhea  or change in bowel habits or change in bladder habits, change in stools or change in urine, dysuria, hematuria,  rash, arthralgias, visual complaints, headache, numbness, weakness or ataxia or problems with walking or coordination,  change in mood or  memory.              Outpatient Medications Prior to Visit  Medication Sig Dispense Refill   albuterol (VENTOLIN HFA) 108 (90 Base) MCG/ACT inhaler Inhale 2 puffs into the lungs every 6 (six) hours as needed.     amLODipine (NORVASC) 5 MG tablet Take 5 mg by mouth daily.     Budeson-Glycopyrrol-Formoterol (BREZTRI AEROSPHERE) 160-9-4.8 MCG/ACT AERO Inhale into the lungs.     cefUROXime (CEFTIN) 500 MG tablet Take 500 mg by mouth 2 (two) times daily with a meal.     cetirizine (ZYRTEC) 10 MG tablet Take 10 mg by mouth daily.     ergocalciferol (VITAMIN D2) 1.25 MG (50000 UT) capsule Take 50,000 Units by mouth once a week.     fluticasone (FLONASE) 50 MCG/ACT nasal spray Place into both nostrils daily.     Golimumab (SIMPONI)  100 MG/ML SOAJ Inject into the skin.     HYDROcodone bit-homatropine (HYCODAN) 5-1.5 MG/5ML syrup Take 5 mLs by mouth every 6 (six) hours as needed.     ibuprofen (ADVIL) 600 MG tablet Take 1 tablet (600 mg total) by mouth every 6 (six) hours as needed. 30 tablet 0   methylphenidate (RITALIN) 10 MG tablet Take 10 mg by mouth 2 (two) times daily.     nebivolol (BYSTOLIC) 5 MG tablet Take 2.5 mg by mouth daily.      omeprazole (PRILOSEC) 20 MG capsule Take 20 mg by mouth daily.     predniSONE (DELTASONE) 10 MG tablet Take 10 mg by mouth daily with breakfast.     Probiotic Product (PROBIOTIC BLEND PO) Take by  mouth.     valACYclovir (VALTREX) 1000 MG tablet Take 1,000 mg by mouth 2 (two) times daily.     valsartan (DIOVAN) 320 MG tablet Take 320 mg by mouth daily.     Adalimumab (HUMIRA PEN) 40 MG/0.4ML PNKT      HYDROcodone-acetaminophen (NORCO/VICODIN) 5-325 MG tablet Take 1 tablet by mouth every 4 (four) hours as needed. 10 tablet 0   valsartan (DIOVAN) 160 MG tablet Take 160 mg by mouth daily.     No facility-administered medications prior to visit.    Past Medical History:  Diagnosis Date   Anxiety    Arrhythmia    Diverticulitis    Herpes    chronic   Hypertension    IBS (irritable bowel syndrome)    Obesity    Rheumatoid arthritis (HCC)    Smoker    Chronic      Objective:     BP 108/69   Pulse 79   Ht 5\' 4"  (1.626 m)   Wt 194 lb (88 kg)   SpO2 95%   BMI 33.30 kg/m   SpO2: 95 % RA  Pleasant amb mildly obese (by BMI)   wf  nad/ mild rattling cough    HEENT : Oropharynx  clear      Nasal turbinates nl    NECK :  without  apparent JVD/ palpable Nodes/TM    LUNGS: no acc muscle use,  Nl contour chest which is clear to A and P bilaterally without cough on insp or exp maneuvers   CV:  RRR  no s3 or murmur or increase in P2, and no edema   ABD:  soft and nontender    MS:  Nl gait/ ext warm without deformities Or obvious joint restrictions  calf tenderness, cyanosis or clubbing    SKIN: warm and dry without lesions    NEURO:  alert, approp, nl sensorium with  no motor or cerebellar deficits apparent.    I personally reviewed images and agree with radiology impression as follows:  CXR:   pa and lateral 11/20/23  Residual opacities in the lingular segment of left upper lobe, moderately improved since the prior study.    Assessment   Pulmonary infiltrates on CXR Onset of symptoms in RA pt newly started on Golimumab with assoc rhinitis sinusitis  - Sinus CT 11/27/2023 >>>   It sounds by hx that her reaction to humira was no Th1 but Th2 mediated since it  recurred 2 day after injection but the infection p sympony was more delayed and more convincing for infection ? Assoc with sinusitis as the trigger and perhaps with post pna cough exac now by GERD so rec  Max gerd rx for now to elminate cyclical cough  Taper off prednisone before adding back biologics if possible  Mucines D prn and proceed with Sinus CT if not formal ENT eval next  Maintain smoking cessation at all costs   F/u in 6 weeks sooner if needed          Each maintenance medication was reviewed in detail including emphasizing most importantly the difference between maintenance and prns and under what circumstances the prns are to be triggered using an action plan format where appropriate.  Total time for H and P, chart review, counseling, reviewing hfa device(s) and generating customized AVS unique to this office visit / same day charting  > 45 min complex new pt eval.           Sandrea Hughs, MD 11/27/2023

## 2023-11-27 ENCOUNTER — Encounter: Payer: Self-pay | Admitting: Internal Medicine

## 2023-11-27 ENCOUNTER — Ambulatory Visit: Payer: 59 | Admitting: Internal Medicine

## 2023-11-27 VITALS — BP 108/69 | HR 79 | Ht 64.0 in | Wt 194.0 lb

## 2023-11-27 DIAGNOSIS — R918 Other nonspecific abnormal finding of lung field: Secondary | ICD-10-CM | POA: Insufficient documentation

## 2023-11-27 DIAGNOSIS — R053 Chronic cough: Secondary | ICD-10-CM | POA: Diagnosis not present

## 2023-11-27 MED ORDER — FAMOTIDINE 20 MG PO TABS: ORAL_TABLET | ORAL | 11 refills | Status: DC

## 2023-11-27 MED ORDER — PANTOPRAZOLE SODIUM 40 MG PO TBEC: 40.0000 mg | DELAYED_RELEASE_TABLET | Freq: Every day | ORAL | 2 refills | Status: DC

## 2023-11-27 NOTE — Patient Instructions (Addendum)
The key is to stop smoking completely before smoking completely stops you!  Try Mucinex D one twice daily as needed for nasal congestion and cough   Only use your albuterol as a rescue medication to be used if you can't catch your breath by resting or doing a relaxed purse lip breathing pattern.  - The less you use it, the better it will work when you need it. - Ok to use up to 2 puffs  every 4 hours if you must but call for immediate appointment if use goes up over your usual need - Don't leave home without it !!  (think of it like the spare tire for your car)   Pantoprazole (protonix) 40 mg   Take  30-60 min before first meal of the day and Pepcid (famotidine)  20 mg after supper until return to office - this is the best way to tell whether stomach acid is contributing to your problem.    GERD (REFLUX)  is an extremely common cause of respiratory symptoms just like yours , many times with no obvious heartburn at all.    It can be treated with medication, but also with lifestyle changes including elevation of the head of your bed (ideally with 6-8inch blocks under the headboard of your bed),  Smoking cessation, avoidance of late meals, excessive alcohol, and avoid fatty foods, chocolate, peppermint, colas, red wine, and acidic juices such as orange juice.  NO MINT OR MENTHOL PRODUCTS SO NO COUGH DROPS  USE SUGARLESS CANDY INSTEAD (Jolley ranchers or Stover's or Life Savers) or even ice chips will also do - the key is to swallow to prevent all throat clearing. NO OIL BASED VITAMINS - use powdered substitutes.  Avoid fish oil when coughing.  My office will be contacting you by phone for referral for Sinus CT   - if you don't hear back from my office within one week please call us back or notify us thru MyChart and we'll address it right away.    Please schedule a follow up office visit in 6 weeks, call sooner if needed

## 2023-11-28 NOTE — Assessment & Plan Note (Signed)
Onset of symptoms in RA pt newly started on Golimumab with assoc rhinitis sinusitis  - Sinus CT 11/27/2023 >>>   It sounds by hx that her reaction to humira was no Th1 but Th2 mediated since it recurred 2 day after injection but the infection p sympony was more delayed and more convincing for infection ? Assoc with sinusitis as the trigger and perhaps with post pna cough exac now by GERD so rec  Max gerd rx for now to elminate cyclical cough  Taper off prednisone before adding back biologics if possible  Mucines D prn and proceed with Sinus CT if not formal ENT eval next  Maintain smoking cessation at all costs   F/u in 6 weeks sooner if needed          Each maintenance medication was reviewed in detail including emphasizing most importantly the difference between maintenance and prns and under what circumstances the prns are to be triggered using an action plan format where appropriate.  Total time for H and P, chart review, counseling, reviewing hfa device(s) and generating customized AVS unique to this office visit / same day charting  > 45 min complex new pt eval.

## 2023-11-30 ENCOUNTER — Telehealth: Payer: Self-pay | Admitting: Internal Medicine

## 2023-11-30 NOTE — Telephone Encounter (Signed)
Fine to reschedule for same day as CT or after

## 2023-11-30 NOTE — Telephone Encounter (Signed)
Left pt a vm regarding CT   Scheduled for 1/22, PT may need to resch f/u with Dr. Sherene Sires on 1/13 if CT results are needed prior.

## 2023-12-04 NOTE — Telephone Encounter (Signed)
Called/LVMTCB for patient to reschedule appointment. Mychart message sent.

## 2023-12-07 NOTE — Telephone Encounter (Signed)
Rescheduled pt CT appt for [facility], pt's insurance was OON for AP   Pt's appt is now set for 12/23 at 12pm. Left pt a vm with this info   No need to reschedule follow up appt

## 2023-12-18 ENCOUNTER — Ambulatory Visit
Admission: RE | Admit: 2023-12-18 | Discharge: 2023-12-18 | Disposition: A | Discharge status: Home / Self Care | Payer: 59 | Source: Ambulatory Visit | Attending: Internal Medicine | Admitting: Internal Medicine

## 2023-12-18 DIAGNOSIS — R053 Chronic cough: Secondary | ICD-10-CM

## 2023-12-18 DIAGNOSIS — J3489 Other specified disorders of nose and nasal sinuses: Secondary | ICD-10-CM | POA: Diagnosis not present

## 2024-01-07 NOTE — Progress Notes (Signed)
 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 x 1st humira worked great but developed sorethroat w/in a few days of taking injection q 2 weeks so stopped in March 2024 > worse RA then August 2024 rx sympony x 2 loading doses > last dose was end of October 2024  w/in a week developed sob/ cough/ nasty sinus drainage > flonase and new start on inhalers  referred to pulmonary clinic in [facility]  11/27/2023 by Dr Rosamond  for ongoing doe and also  sometimes  sob during coughing  fits which are moslty  non productive  / nasal drainge has turned clear/ was green worse in ams and overall 75% improved at time of ov but still tapering off prednisone     History of Present Illness  11/27/2023  Pulmonary/ 1st office eval/ Kayla Olson / [place] [facility] Breztri/ albuterol/ prednisone  10 mg bid  Chief Complaint  Patient presents with   Establish Care   Cough  Dyspnea:  no regular ex / min so unless  Cough: mostly dry p lie down  Sleep:  flat bed 2-3 pillows prefer R side down / can't breathe on L  SABA use: q 4h w/a  02: none  Lung cancer screen: none  Rec The key is to stop smoking completely before smoking completely stops you! Try Mucinex D one twice daily as needed for nasal congestion and cough  Only use your albuterol as a rescue medication   Pantoprazole  (protonix ) 40 mg Take  30-60 min before first meal of the day and Pepcid  (famotidine )  20 mg after supper until return to office  GERD diet reviewed, bed blocks rec   Sinus CT   12/18/23  >  neg   01/08/2024  f/u ov/[place] office/Kayla Olson re: abn cxr ? Lingular syndroe  maint on brezgtri   last smoked 01/03/23  - did everything on list except  for pepcid   - mucinex d  no better  Chief Complaint  Patient presents with   Cough  Dyspnea:  better  Cough: most noticeable p shower- ? Some better p  zyrtec bid Sleeping: flat bed (no bed blocks) wedge pillow s resp cc  SABA use: rarely using  02: not   Best rx = prednisone but saba not helpful   No obvious day to day or daytime variability or assoc excess/ purulent sputum or mucus plugs or hemoptysis or cp or chest tightness, subjective wheeze or overt sinus or hb symptoms.    Also denies any obvious fluctuation of symptoms with weather or environmental changes or other aggravating or alleviating factors except as outlined above   No unusual exposure hx or h/o childhood pna/ asthma or knowledge of premature birth.  Current Allergies, Complete Past Medical History, Past Surgical History, Family History, and Social History were reviewed in [employer] record.  ROS  The following are not active complaints unless bolded Hoarseness, sore throat = globus sensation  dysphagia, dental problems, itching, sneezing,  nasal congestion or sense of discharge of excess mucus or purulent secretions, ear ache,   fever, chills, sweats, unintended wt loss or wt gain, classically pleuritic or exertional cp,  orthopnea pnd or arm/hand swelling  or leg swelling, presyncope, palpitations, abdominal pain, anorexia, nausea, vomiting, diarrhea  or change in bowel  habits or change in bladder habits, change in stools or change in urine, dysuria, hematuria,  rash, arthralgias, visual complaints, headache, numbness, weakness or ataxia or problems with walking or coordination,  change in mood or  memory.        Current Meds  Medication Sig   albuterol (VENTOLIN HFA) 108 (90 Base) MCG/ACT inhaler Inhale 2 puffs into the lungs every 6 (six) hours as needed.   amLODipine (NORVASC) 5 MG tablet Take 5 mg by mouth daily.   Budeson-Glycopyrrol-Formoterol (BREZTRI AEROSPHERE) 160-9-4.8 MCG/ACT AERO Inhale into the lungs.   cetirizine (ZYRTEC) 10 MG tablet Take 10 mg by mouth daily.   ergocalciferol (VITAMIN D2) 1.25 MG (50000 UT) capsule Take 50,000  Units by mouth once a week.   famotidine  (PEPCID ) 20 MG tablet One after supper   fluticasone (FLONASE) 50 MCG/ACT nasal spray Place into both nostrils daily.   ibuprofen  (ADVIL ) 600 MG tablet Take 1 tablet (600 mg total) by mouth every 6 (six) hours as needed.   methylphenidate (RITALIN) 10 MG tablet Take 10 mg by mouth 2 (two) times daily.   nebivolol (BYSTOLIC) 5 MG tablet Take 2.5 mg by mouth daily.    pantoprazole  (PROTONIX ) 40 MG tablet Take 1 tablet (40 mg total) by mouth daily. Take 30-60 min before first meal of the day   Probiotic Product (PROBIOTIC BLEND PO) Take by mouth.   valACYclovir (VALTREX) 1000 MG tablet Take 1,000 mg by mouth 2 (two) times daily.   valsartan (DIOVAN) 320 MG tablet Take 320 mg by mouth daily.              Past Medical History:  Diagnosis Date   Anxiety    Arrhythmia    Diverticulitis    Herpes    chronic   Hypertension    IBS (irritable bowel syndrome)    Obesity    Rheumatoid arthritis (HCC)    Smoker    Chronic      Objective:   Wts   01/08/2024       200   11/27/23 194 lb (88 kg)  12/29/21 210 lb (95.3 kg)  05/02/17 158 lb (71.7 kg)      Vital signs reviewed  01/08/2024  - Note at rest 02 sats  97% on RA    General appearance:    amb wf very harsh cough not reproduced on insp or exp assoc with freq thoat clearing with pristine orophx  Head:  nl turbinates  NECK :  without  apparent JVD/ palpable Nodes/TM    LUNGS: no acc muscle use,  Nl contour chest which is clear to A and P bilaterally without cough on insp or exp maneuvers   CV:  RRR  no s3 or murmur or increase in P2, and no edema   ABD:  soft and nontender   MS:  Gait nl   ext warm without deformities Or obvious joint restrictions  calf tenderness, cyanosis or clubbing    SKIN: warm and dry without lesions    NEURO:  alert, approp, nl sensorium with  no motor or cerebellar deficits apparent.       CXR PA and Lateral:   01/08/2024 :    I personally reviewed  images and agree with radiology impression as follows:    No active cardiopulmonary disease.     Assessment

## 2024-01-08 ENCOUNTER — Ambulatory Visit: Payer: 59 | Admitting: Internal Medicine

## 2024-01-08 ENCOUNTER — Encounter: Payer: Self-pay | Admitting: Internal Medicine

## 2024-01-08 ENCOUNTER — Ambulatory Visit (HOSPITAL_COMMUNITY)
Admission: RE | Admit: 2024-01-08 | Discharge: 2024-01-08 | Disposition: A | Discharge status: Home / Self Care | Payer: 59 | Source: Ambulatory Visit | Attending: Internal Medicine | Admitting: Internal Medicine

## 2024-01-08 VITALS — BP 111/59 | HR 71 | Ht 64.0 in | Wt 200.0 lb

## 2024-01-08 DIAGNOSIS — J45991 Cough variant asthma: Secondary | ICD-10-CM | POA: Insufficient documentation

## 2024-01-08 DIAGNOSIS — R053 Chronic cough: Secondary | ICD-10-CM

## 2024-01-08 DIAGNOSIS — R059 Cough, unspecified: Secondary | ICD-10-CM | POA: Diagnosis not present

## 2024-01-08 MED ORDER — METHYLPREDNISOLONE ACETATE 80 MG/ML IJ SUSP
120.0000 mg | Freq: Once | INTRAMUSCULAR | Status: AC
  Administered 2024-01-08: 120 mg via INTRAMUSCULAR

## 2024-01-08 MED ORDER — AIRSUPRA 90-80 MCG/ACT IN AERO: 2.0000 | INHALATION_SPRAY | RESPIRATORY_TRACT | 11 refills | Status: AC | PRN

## 2024-01-08 NOTE — Assessment & Plan Note (Addendum)
 Onset Oct 2024 assoc with use of Sympony / active uri  - Sinus CT 12/18/2023 >>>  wnl  - Allergy screen 01/08/2024 >  Eos 0. /  IgE  pending  - Trial of airsupra  01/08/2024 >>>  Absence of cough sleeping or waking in am in favor of UACS > asthma but many coughs are due to more more than one source so try  Depomedrol 120 mg IM for any Th2 driven component   Continue mucinex DM up to every 12 hours as needed best choice for daytime cough   Air Supra up to 2 puffs every 4 hours but for now start with 2 puffs first thing in am and anytime you can anticipate you are likely  to cough to cover cough variant or irritant asthma a   Pepcid  20 mg each evening after supper           Each maintenance medication was reviewed in detail including emphasizing most importantly the difference between maintenance and prns and under what circumstances the prns are to be triggered using an action plan format where appropriate.  Total time for H and P, chart review, counseling, reviewing hfa  device(s) and generating customized AVS unique to this office visit / same day charting  > 30 min for  refractory respiratory  symptoms of uncertain etiology

## 2024-01-08 NOTE — Patient Instructions (Addendum)
 Depomedrol 120 mg IM   Continue mucinex DM up to every 12 hours as needed best choice for daytime cough   Air Supra up to 2 puffs every 4 hours but for now start with 2 puffs first thing in am and anytime you can anticipate you are likely  to cough.   Pepcid  20 mg each evening after supper   Please remember to go to the lab department   for your tests - we will call you with the results when they are available.      Please remember to go to the  x-ray department  @  [facility] for your tests - we will call you with the results when they are available     Please schedule a follow up office visit in 6 weeks, call sooner if needed

## 2024-01-17 ENCOUNTER — Other Ambulatory Visit (HOSPITAL_COMMUNITY): Payer: 59

## 2024-01-19 LAB — CBC WITH DIFFERENTIAL/PLATELET
Basophils Absolute: 0.1 10*3/uL (ref 0.0–0.2)
Basos: 1 %
EOS (ABSOLUTE): 0 10*3/uL (ref 0.0–0.4)
Eos: 0 %
Hematocrit: 43.7 % (ref 34.0–46.6)
Hemoglobin: 13.9 g/dL (ref 11.1–15.9)
Immature Grans (Abs): 0 10*3/uL (ref 0.0–0.1)
Immature Granulocytes: 0 %
Lymphocytes Absolute: 2.8 10*3/uL (ref 0.7–3.1)
Lymphs: 47 %
MCH: 29.8 pg (ref 26.6–33.0)
MCHC: 31.8 g/dL (ref 31.5–35.7)
MCV: 94 fL (ref 79–97)
Monocytes Absolute: 0.7 10*3/uL (ref 0.1–0.9)
Monocytes: 12 %
Neutrophils Absolute: 2.4 10*3/uL (ref 1.4–7.0)
Neutrophils: 40 %
Platelets: 241 10*3/uL (ref 150–450)
RBC: 4.67 x10E6/uL (ref 3.77–5.28)
RDW: 13.2 % (ref 11.7–15.4)
WBC: 6 10*3/uL (ref 3.4–10.8)

## 2024-01-19 LAB — ALPHA-1-ANTITRYPSIN PHENOTYP: A-1 Antitrypsin: 155 mg/dL (ref 101–187)

## 2024-01-19 LAB — SEDIMENTATION RATE: Sed Rate: 8 mm/h (ref 0–40)

## 2024-01-19 LAB — IGE: IgE (Immunoglobulin E), Serum: 60 [IU]/mL (ref 6–495)

## 2024-02-17 NOTE — Progress Notes (Deleted)
 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 x 1st humira worked great but developed sorethroat w/in a few days of taking injection q 2 weeks so stopped in March 2024 > worse RA then August 2024 rx sympony x 2 loading doses > last dose was end of October 2024  w/in a week developed sob/ cough/ nasty sinus drainage > flonase and new start on inhalers  referred to pulmonary clinic in [facility]  11/27/2023 by Dr Sherril Croon  for ongoing doe and also  sometimes  sob during coughing  fits which are moslty  non productive  / nasal drainge has turned clear/ was green worse in ams and overall 75% improved at time of ov but still tapering off prednisone     History of Present Illness  11/27/2023  Pulmonary/ 1st office eval/ Kayla Olson / [place] [facility] Breztri/ albuterol/ prednisone  10 mg bid  Chief Complaint  Patient presents with   Establish Care   Cough  Dyspnea:  no regular ex / min so unless  Cough: mostly dry p lie down  Sleep:  flat bed 2-3 pillows prefer R side down / can't breathe on L  SABA use: q 4h w/a  02: none  Lung cancer screen: none  Rec The key is to stop smoking completely before smoking completely stops you! Try Mucinex D one twice daily as needed for nasal congestion and cough  Only use your albuterol as a rescue medication   Pantoprazole (protonix) 40 mg Take  30-60 min before first meal of the day and Pepcid (famotidine)  20 mg after supper until return to office  GERD diet reviewed, bed blocks rec   Sinus CT   12/18/23  >  neg   01/08/2024  f/u ov/[place] office/Johncharles Fusselman re: abn cxr ? Lingular syndroe  maint on brezgtri   last smoked 01/03/23  - did everything on list except  for pepcid  - mucinex d  no better  Chief Complaint  Patient presents with   Cough  Dyspnea:  better  Cough: most noticeable p shower- ? Some better p  zyrtec bid Sleeping: flat bed (no bed blocks) wedge pillow s resp cc  SABA use: rarely using  02: not  Best rx = prednisone but saba not helpful Rec Depomedrol 120 mg IM  Continue mucinex DM up to every 12 hours as needed best choice for daytime cough  Air Supra up to 2 puffs every 4 hours but for now start with 2 puffs first thing in am and anytime you can anticipate you are likely  to cough. Pepcid 20 mg each evening after supper   Allergy screen 01/08/2024 >  Eos 0. 0/  IgE 60  alpha one AT  FM  level 155 /cxr ok    02/19/2024  f/u ov/[place] office/Brevin Mcfadden re: *** maint on ***  No chief complaint on file.   Dyspnea:  *** Cough: *** Sleeping: ***   resp cc  SABA use: *** 02: ***  Lung cancer screening: ***   No obvious day to day or daytime variability or assoc excess/ purulent sputum or mucus plugs or hemoptysis or cp or chest tightness, subjective wheeze or overt sinus or hb symptoms.    Also denies any obvious fluctuation of symptoms with weather or environmental changes  or other aggravating or alleviating factors except as outlined above   No unusual exposure hx or h/o childhood pna/ asthma or knowledge of premature birth.  Current Allergies, Complete Past Medical History, Past Surgical History, Family History, and Social History were reviewed in [employer] record.  ROS  The following are not active complaints unless bolded Hoarseness, sore throat, dysphagia, dental problems, itching, sneezing,  nasal congestion or discharge of excess mucus or purulent secretions, ear ache,   fever, chills, sweats, unintended wt loss or wt gain, classically pleuritic or exertional cp,  orthopnea pnd or arm/hand swelling  or leg swelling, presyncope, palpitations, abdominal pain, anorexia, nausea, vomiting, diarrhea  or change in bowel habits or change in bladder habits, change in stools or change in urine, dysuria, hematuria,  rash, arthralgias, visual complaints,  headache, numbness, weakness or ataxia or problems with walking or coordination,  change in mood or  memory.        No outpatient medications have been marked as taking for the 02/19/24 encounter (Appointment) with Nyoka Cowden, MD.                Past Medical History:  Diagnosis Date   Anxiety    Arrhythmia    Diverticulitis    Herpes    chronic   Hypertension    IBS (irritable bowel syndrome)    Obesity    Rheumatoid arthritis (HCC)    Smoker    Chronic      Objective:   Wts  02/19/2024         ***   01/08/2024       200   11/27/23 194 lb (88 kg)  12/29/21 210 lb (95.3 kg)  05/02/17 158 lb (71.7 kg)    Vital signs reviewed  02/19/2024  - Note at rest 02 sats  ***% on ***   General appearance:    ***        Assessment

## 2024-02-19 ENCOUNTER — Encounter: Payer: Self-pay | Admitting: Internal Medicine

## 2024-02-19 ENCOUNTER — Ambulatory Visit: Payer: 59 | Admitting: Internal Medicine

## 2024-03-11 ENCOUNTER — Other Ambulatory Visit: Payer: Self-pay | Admitting: Internal Medicine

## 2024-03-11 DIAGNOSIS — R918 Other nonspecific abnormal finding of lung field: Secondary | ICD-10-CM

## 2024-03-11 NOTE — Telephone Encounter (Signed)
 Pt missed her last appt, is this okay for [place] to refill this

## 2024-04-01 DIAGNOSIS — R7989 Other specified abnormal findings of blood chemistry: Secondary | ICD-10-CM | POA: Diagnosis not present

## 2024-04-01 DIAGNOSIS — R5383 Other fatigue: Secondary | ICD-10-CM | POA: Diagnosis not present

## 2024-04-01 DIAGNOSIS — E669 Obesity, unspecified: Secondary | ICD-10-CM | POA: Diagnosis not present

## 2024-04-01 DIAGNOSIS — Z111 Encounter for screening for respiratory tuberculosis: Secondary | ICD-10-CM | POA: Diagnosis not present

## 2024-04-01 DIAGNOSIS — M0579 Rheumatoid arthritis with rheumatoid factor of multiple sites without organ or systems involvement: Secondary | ICD-10-CM | POA: Diagnosis not present

## 2024-04-01 DIAGNOSIS — Z6833 Body mass index (BMI) 33.0-33.9, adult: Secondary | ICD-10-CM | POA: Diagnosis not present

## 2024-04-01 DIAGNOSIS — Z79899 Other long term (current) drug therapy: Secondary | ICD-10-CM | POA: Diagnosis not present

## 2024-04-02 ENCOUNTER — Other Ambulatory Visit: Payer: Self-pay | Admitting: Internal Medicine

## 2024-04-02 DIAGNOSIS — R918 Other nonspecific abnormal finding of lung field: Secondary | ICD-10-CM

## 2024-04-29 ENCOUNTER — Other Ambulatory Visit: Payer: Self-pay | Admitting: Internal Medicine

## 2024-04-29 DIAGNOSIS — R918 Other nonspecific abnormal finding of lung field: Secondary | ICD-10-CM

## 2024-05-07 DIAGNOSIS — R109 Unspecified abdominal pain: Secondary | ICD-10-CM | POA: Diagnosis not present

## 2024-05-29 ENCOUNTER — Other Ambulatory Visit: Payer: Self-pay | Admitting: Internal Medicine

## 2024-05-29 DIAGNOSIS — R918 Other nonspecific abnormal finding of lung field: Secondary | ICD-10-CM

## 2024-06-27 ENCOUNTER — Other Ambulatory Visit: Payer: Self-pay | Admitting: Internal Medicine

## 2024-06-27 DIAGNOSIS — R918 Other nonspecific abnormal finding of lung field: Secondary | ICD-10-CM

## 2024-06-27 NOTE — Telephone Encounter (Signed)
 Dr wert you have this pt on pecid from last OV 01/08/2024 note - OV before last 11/27/2023 states:   (Pantoprazole  (protonix ) 40 mg Take 30-60 min before first meal of the day and Pepcid  (famotidine ) 20 mg after supper until return to office - this is the best way to tell whether stomach acid is contributing to your problem.  Do you want me to refill her prontonix?

## 2024-07-02 DIAGNOSIS — Z79899 Other long term (current) drug therapy: Secondary | ICD-10-CM | POA: Diagnosis not present

## 2024-07-02 DIAGNOSIS — M7062 Trochanteric bursitis, left hip: Secondary | ICD-10-CM | POA: Diagnosis not present

## 2024-07-02 DIAGNOSIS — M0579 Rheumatoid arthritis with rheumatoid factor of multiple sites without organ or systems involvement: Secondary | ICD-10-CM | POA: Diagnosis not present

## 2024-07-02 DIAGNOSIS — Z6833 Body mass index (BMI) 33.0-33.9, adult: Secondary | ICD-10-CM | POA: Diagnosis not present

## 2024-07-02 DIAGNOSIS — R7989 Other specified abnormal findings of blood chemistry: Secondary | ICD-10-CM | POA: Diagnosis not present

## 2024-07-02 DIAGNOSIS — M7061 Trochanteric bursitis, right hip: Secondary | ICD-10-CM | POA: Diagnosis not present

## 2024-07-02 DIAGNOSIS — R5383 Other fatigue: Secondary | ICD-10-CM | POA: Diagnosis not present

## 2024-07-02 DIAGNOSIS — E669 Obesity, unspecified: Secondary | ICD-10-CM | POA: Diagnosis not present

## 2024-10-24 DIAGNOSIS — Z79899 Other long term (current) drug therapy: Secondary | ICD-10-CM | POA: Diagnosis not present

## 2024-10-24 DIAGNOSIS — M7061 Trochanteric bursitis, right hip: Secondary | ICD-10-CM | POA: Diagnosis not present

## 2024-10-24 DIAGNOSIS — E669 Obesity, unspecified: Secondary | ICD-10-CM | POA: Diagnosis not present

## 2024-10-24 DIAGNOSIS — Z111 Encounter for screening for respiratory tuberculosis: Secondary | ICD-10-CM | POA: Diagnosis not present

## 2024-10-24 DIAGNOSIS — Z6831 Body mass index (BMI) 31.0-31.9, adult: Secondary | ICD-10-CM | POA: Diagnosis not present

## 2024-10-24 DIAGNOSIS — R5383 Other fatigue: Secondary | ICD-10-CM | POA: Diagnosis not present

## 2024-10-24 DIAGNOSIS — R7989 Other specified abnormal findings of blood chemistry: Secondary | ICD-10-CM | POA: Diagnosis not present

## 2024-10-24 DIAGNOSIS — M7062 Trochanteric bursitis, left hip: Secondary | ICD-10-CM | POA: Diagnosis not present

## 2024-10-24 DIAGNOSIS — M0579 Rheumatoid arthritis with rheumatoid factor of multiple sites without organ or systems involvement: Secondary | ICD-10-CM | POA: Diagnosis not present

## 2024-10-31 ENCOUNTER — Other Ambulatory Visit: Payer: Self-pay | Admitting: Internal Medicine

## 2024-10-31 DIAGNOSIS — R918 Other nonspecific abnormal finding of lung field: Secondary | ICD-10-CM

## 2024-11-01 NOTE — Telephone Encounter (Signed)
 Pt was supposed to be seen 6 weeks after her LOV on 01-08-2024, pt requesting protonix  refill would you like me to refill, make appt or both?  Please advise

## 2024-11-28 ENCOUNTER — Other Ambulatory Visit: Payer: Self-pay | Admitting: Internal Medicine

## 2024-11-28 DIAGNOSIS — R918 Other nonspecific abnormal finding of lung field: Secondary | ICD-10-CM

## 2024-12-31 ENCOUNTER — Other Ambulatory Visit: Payer: Self-pay | Admitting: Internal Medicine

## 2024-12-31 DIAGNOSIS — R918 Other nonspecific abnormal finding of lung field: Secondary | ICD-10-CM

## 2025-01-01 NOTE — Telephone Encounter (Signed)
 Pepcid  refill. Per Dr. Chari 01/08/24 OV notes:  Pepcid  20 mg each evening after supper.  Pt will need an appt for further refills.
